# Patient Record
Sex: Female | Born: 1945 | Race: White | Hispanic: No | Marital: Married | Smoking: Never smoker
Health system: Southern US, Community
[De-identification: ages and names within clinical notes are randomized; demographics above are authoritative.]

## PROBLEM LIST (undated history)

## (undated) DIAGNOSIS — C50919 Malignant neoplasm of unspecified site of unspecified female breast: Secondary | ICD-10-CM

## (undated) DIAGNOSIS — L570 Actinic keratosis: Secondary | ICD-10-CM

## (undated) DIAGNOSIS — I10 Essential (primary) hypertension: Secondary | ICD-10-CM

## (undated) DIAGNOSIS — Z923 Personal history of irradiation: Secondary | ICD-10-CM

## (undated) HISTORY — DX: Actinic keratosis: L57.0

---

## 2006-05-04 ENCOUNTER — Ambulatory Visit: Payer: Self-pay | Admitting: Unknown Physician Specialty

## 2006-10-12 HISTORY — PX: BREAST LUMPECTOMY: SHX2

## 2006-10-12 HISTORY — PX: BREAST BIOPSY: SHX20

## 2007-05-04 ENCOUNTER — Encounter: Payer: Self-pay | Admitting: Family Medicine

## 2008-12-11 ENCOUNTER — Ambulatory Visit: Payer: Self-pay | Admitting: Internal Medicine

## 2008-12-11 DIAGNOSIS — D0590 Unspecified type of carcinoma in situ of unspecified breast: Secondary | ICD-10-CM | POA: Insufficient documentation

## 2008-12-11 DIAGNOSIS — I10 Essential (primary) hypertension: Secondary | ICD-10-CM | POA: Insufficient documentation

## 2009-03-26 ENCOUNTER — Ambulatory Visit: Payer: Self-pay | Admitting: Internal Medicine

## 2009-03-26 LAB — CONVERTED CEMR LAB
ALT: 21 units/L (ref 0–35)
AST: 25 units/L (ref 0–37)
Alkaline Phosphatase: 107 units/L (ref 39–117)
BUN: 19 mg/dL (ref 6–23)
Basophils Relative: 0.1 % (ref 0.0–3.0)
Bilirubin, Direct: 0 mg/dL (ref 0.0–0.3)
Chloride: 107 meq/L (ref 96–112)
Creatinine, Ser: 1.2 mg/dL (ref 0.4–1.2)
Eosinophils Relative: 2.6 % (ref 0.0–5.0)
GFR calc non Af Amer: 48.48 mL/min (ref >60)
LDL Cholesterol: 110 mg/dL — ABNORMAL HIGH (ref 0–99)
MCV: 92.5 fL (ref 78.0–100.0)
Monocytes Absolute: 0.4 10*3/uL (ref 0.1–1.0)
Monocytes Relative: 7 % (ref 3.0–12.0)
Neutrophils Relative %: 69 % (ref 43.0–77.0)
Platelets: 237 10*3/uL (ref 150.0–400.0)
Potassium: 3.7 meq/L (ref 3.5–5.1)
RBC: 4.37 M/uL (ref 3.87–5.11)
Total Bilirubin: 0.9 mg/dL (ref 0.3–1.2)
Total CHOL/HDL Ratio: 4
Total Protein: 7.7 g/dL (ref 6.0–8.3)
Triglycerides: 67 mg/dL (ref 0.0–149.0)
VLDL: 13.4 mg/dL (ref 0.0–40.0)
WBC: 6.4 10*3/uL (ref 4.5–10.5)

## 2009-07-02 ENCOUNTER — Ambulatory Visit: Payer: Self-pay | Admitting: Internal Medicine

## 2009-07-02 DIAGNOSIS — J069 Acute upper respiratory infection, unspecified: Secondary | ICD-10-CM | POA: Insufficient documentation

## 2009-07-04 ENCOUNTER — Telehealth (INDEPENDENT_AMBULATORY_CARE_PROVIDER_SITE_OTHER): Payer: Self-pay | Admitting: *Deleted

## 2009-10-12 DIAGNOSIS — C439 Malignant melanoma of skin, unspecified: Secondary | ICD-10-CM

## 2009-10-12 HISTORY — DX: Malignant melanoma of skin, unspecified: C43.9

## 2009-12-23 ENCOUNTER — Encounter: Payer: Self-pay | Admitting: Internal Medicine

## 2010-05-30 ENCOUNTER — Encounter: Payer: Self-pay | Admitting: Internal Medicine

## 2010-11-03 ENCOUNTER — Other Ambulatory Visit: Payer: Self-pay

## 2010-11-07 ENCOUNTER — Inpatient Hospital Stay: Payer: Self-pay | Admitting: Specialist

## 2010-11-11 NOTE — Letter (Signed)
Summary: Medical Clearance for Exercise Program  Medical Clearance for Exercise Program   Imported By: Maryln Gottron 12/25/2009 12:14:47  _____________________________________________________________________  External Attachment:    Type:   Image     Comment:   External Document

## 2010-11-11 NOTE — Letter (Signed)
Summary: [facility]  [facility]   Imported By: Maryln Gottron 06/13/2010 15:38:08  _____________________________________________________________________  External Attachment:    Type:   Image     Comment:   External Document

## 2010-11-12 LAB — PATHOLOGY REPORT

## 2011-04-23 ENCOUNTER — Ambulatory Visit: Payer: Self-pay | Admitting: Internal Medicine

## 2016-10-27 ENCOUNTER — Other Ambulatory Visit: Payer: Self-pay | Admitting: Otolaryngology

## 2016-10-27 DIAGNOSIS — R1310 Dysphagia, unspecified: Secondary | ICD-10-CM

## 2016-11-10 ENCOUNTER — Other Ambulatory Visit: Payer: Self-pay

## 2016-12-14 ENCOUNTER — Ambulatory Visit
Admission: RE | Admit: 2016-12-14 | Discharge: 2016-12-14 | Disposition: A | Discharge status: Home / Self Care | Payer: BLUE CROSS/BLUE SHIELD | Source: Ambulatory Visit | Attending: Otolaryngology | Admitting: Otolaryngology

## 2016-12-14 DIAGNOSIS — R1314 Dysphagia, pharyngoesophageal phase: Secondary | ICD-10-CM | POA: Diagnosis present

## 2016-12-14 DIAGNOSIS — R1312 Dysphagia, oropharyngeal phase: Secondary | ICD-10-CM

## 2016-12-14 NOTE — Therapy (Signed)
Kendra Vang, [place], [address] Phone: [phone]   Fax:     Modified Barium Swallow  Patient Details  Name: Kendra Vang MRN: [id number] Date of Birth: 1945-11-01 No Data Recorded  Encounter Date: 12/14/2016      End of Session - 12/14/16 1338    Visit Number 1   Number of Visits 1   Date for SLP Re-Evaluation 12/14/16   SLP Start Time [id number]   SLP Stop Time  1339   SLP Time Calculation (min) 50 min   Activity Tolerance Patient tolerated treatment well      No past medical history on file.  No past surgical history on file.  There were no vitals filed for this visit.   Subjective: Patient behavior: (alertness, ability to follow instructions, etc.): The patient is able to follow directions and express her swallowing symptoms.  Chief complaint:  Foods get stuck, happens less if she avoids bread and takes small bites/sips   Objective:  Radiological Procedure: A videoflouroscopic evaluation of oral-preparatory, reflex initiation, and pharyngeal phases of the swallow was performed; as well as a screening of the upper esophageal phase.  I. POSTURE: Upright in MBS chair and standing  II. VIEW: Lateral and A-P  III. COMPENSATORY STRATEGIES: N/A  IV. BOLUSES ADMINISTERED:   Thin Liquid: 2 small sips, 4 rapid consecutive sips   Nectar-thick Liquid: 2 moderate size sips   Honey-thick Liquid: N/A   Puree: 3 teaspoon presentations   Mechanical Soft: 1/4 graham cracker in applesauce  V. RESULTS OF EVALUATION: A. ORAL PREPARATORY PHASE: (The lips, tongue, and velum are observed for strength and coordination)       **Overall Severity Rating: Within normal limits  B. SWALLOW INITIATION/REFLEX: (The reflex is normal if "triggered" by the time the bolus reached the base of the tongue)  **Overall Severity Rating: Minimal; triggers at the valleculae  C. PHARYNGEAL PHASE: (Pharyngeal function is  normal if the bolus shows rapid, smooth, and continuous transit through the pharynx and there is no pharyngeal residue after the swallow)  **Overall Severity Rating: Mild; reduced hyolaryngeal excursion and incomplete epiglottic inversion.  Minimal pharyngeal residue post swallow- primarily valleculae.  D. LARYNGEAL PENETRATION: (Material entering into the laryngeal inlet/vestibule but not aspirated) None  E. ASPIRATION: None  F. ESOPHAGEAL PHASE: (Screening of the upper esophagus) In the cervical esophagus there is a finger-like protrusion along the posterior wall during swallow (does not impede flow of boluses) consistent with prominent cricopharyngeus.  An esophageal sweep in the upright position with liquid and solid consistencies showed mid- and distal-esophageal retention with retrograde flow below the level of the pharyngoesophageal segment.     ASSESSMENT: This 71 year old woman, with difficulty swallowing solids and known thyroid nodule, is presenting with minimal oropharyngeal dysphagia.  Oral control of the bolus including oral hold, rotary mastication, and anterior to posterior transfer are within normal limits. Timing of the pharyngeal swallow is minimally delayed, triggering at the valleculae.  In the pharyngeal stage of swallowing the patient demonstrates decreased hyolaryngeal excursion and incomplete epiglottic inversion.  There is minimal residue post swallow, primarily in the valleculae.  There was no observed laryngeal penetration or tracheal aspiration.  The patient is not at significant risk for prandial aspiration and the minor changes in her oropharyngeal swallow function do not appear to be the source of her symptoms.  In the cervical esophagus there is a finger-like protrusion along the posterior wall during swallow (does  not impede flow of boluses) consistent with prominent cricopharyngeus.  An esophageal sweep in the upright position with liquid and solid consistencies showed  mid- and distal-esophageal retention with retrograde flow below the level of the pharyngoesophageal segment.   The patient was counseled to eat slowly, chew well, small bites and sips, and alternate liquids and solids.  PLAN/RECOMMENDATIONS:   A. Diet: Regular as tolerated- soften and moisten as needed   B. Swallowing Precautions:  eat slowly, chew well, small bites and sips, and alternate liquids and solids.   C. Recommended consultation to: follow up with Dr. Pryor Ochoa as recommended   D. Therapy recommendations N/A   E. Results and recommendations were discussed with the patient immediately following the study and the final report routed to the referring MD.    Oropharyngeal dysphagia - Plan: DG SWALLOWING [place], DG SWALLOWING [facility]        Problem List Patient Active Problem List   Diagnosis Date Noted  . URI 07/02/2009  . CARCINOMA IN SITU OF BREAST 12/11/2008  . HYPERTENSION 12/11/2008   Leroy Sea, MS/CCC- SLP  Lou Miner 12/14/2016, 1:40 PM  [facility] [place], [place], [address] Phone: [phone]   Fax:     Name: Kendra Vang MRN: [id number] Date of Birth: [birth year]

## 2017-03-11 ENCOUNTER — Other Ambulatory Visit: Payer: Self-pay | Admitting: Internal Medicine

## 2017-03-11 DIAGNOSIS — Z853 Personal history of malignant neoplasm of breast: Secondary | ICD-10-CM

## 2017-03-12 ENCOUNTER — Other Ambulatory Visit: Payer: Self-pay | Admitting: *Deleted

## 2017-03-12 ENCOUNTER — Inpatient Hospital Stay
Admission: RE | Admit: 2017-03-12 | Discharge: 2017-03-12 | Disposition: A | Discharge status: Home / Self Care | Payer: Self-pay | Source: Ambulatory Visit | Attending: *Deleted | Admitting: *Deleted

## 2017-03-12 DIAGNOSIS — Z9289 Personal history of other medical treatment: Secondary | ICD-10-CM

## 2017-03-21 ENCOUNTER — Encounter: Payer: Self-pay | Admitting: Emergency Medicine

## 2017-03-21 ENCOUNTER — Emergency Department
Admission: EM | Admit: 2017-03-21 | Discharge: 2017-03-22 | Disposition: A | Discharge status: Home / Self Care | Payer: BLUE CROSS/BLUE SHIELD | Attending: Emergency Medicine | Admitting: Emergency Medicine

## 2017-03-21 DIAGNOSIS — I10 Essential (primary) hypertension: Secondary | ICD-10-CM

## 2017-03-21 HISTORY — DX: Essential (primary) hypertension: I10

## 2017-03-21 LAB — URINALYSIS, COMPLETE (UACMP) WITH MICROSCOPIC
BILIRUBIN URINE: NEGATIVE
Glucose, UA: NEGATIVE mg/dL
KETONES UR: NEGATIVE mg/dL
NITRITE: NEGATIVE
Protein, ur: 30 mg/dL — AB
SPECIFIC GRAVITY, URINE: 1.003 — AB (ref 1.005–1.030)
pH: 6 (ref 5.0–8.0)

## 2017-03-21 LAB — CBC
HEMATOCRIT: 40 % (ref 35.0–47.0)
HEMOGLOBIN: 13.6 g/dL (ref 12.0–16.0)
MCH: 30.2 pg (ref 26.0–34.0)
MCHC: 33.9 g/dL (ref 32.0–36.0)
MCV: 88.9 fL (ref 80.0–100.0)
Platelets: 283 10*3/uL (ref 150–440)
RBC: 4.5 MIL/uL (ref 3.80–5.20)
RDW: 14.3 % (ref 11.5–14.5)
WBC: 12.5 10*3/uL — ABNORMAL HIGH (ref 3.6–11.0)

## 2017-03-21 LAB — BASIC METABOLIC PANEL
Anion gap: 9 (ref 5–15)
BUN: 27 mg/dL — ABNORMAL HIGH (ref 6–20)
CHLORIDE: 104 mmol/L (ref 101–111)
CO2: 27 mmol/L (ref 22–32)
Calcium: 9.3 mg/dL (ref 8.9–10.3)
Creatinine, Ser: 1.25 mg/dL — ABNORMAL HIGH (ref 0.44–1.00)
GFR, EST AFRICAN AMERICAN: 50 mL/min — AB (ref >60)
GFR, EST NON AFRICAN AMERICAN: 43 mL/min — AB (ref >60)
GLUCOSE: 100 mg/dL — AB (ref 65–99)
POTASSIUM: 3.5 mmol/L (ref 3.5–5.1)
SODIUM: 140 mmol/L (ref 135–145)

## 2017-03-21 LAB — TROPONIN I

## 2017-03-21 MED ORDER — LORAZEPAM 2 MG/ML IJ SOLN
0.5000 mg | Freq: Once | INTRAMUSCULAR | Status: AC
  Administered 2017-03-21: 0.5 mg via INTRAVENOUS
  Filled 2017-03-21: qty 1

## 2017-03-21 NOTE — ED Triage Notes (Signed)
Pt ambulatory to triage with steady slow gait. Pt had a procedure on Thursday where Dr. Tiffany Kocher stretched pts esophagus and removed "several" polyps. Per pt, since procedure, pt has had uncontrolled hypertension. Pt denies symptoms of chest pain, headache or visual changes. Pts BP in triage 265/114. Pt taken to RM 15.

## 2017-03-21 NOTE — ED Provider Notes (Signed)
[facility] Emergency Department Provider Note  Time seen: 11:28 PM  I have reviewed the triage vital signs and the nursing notes.   HISTORY  Chief Complaint Hypertension    HPI Kendra Vang is a 71 y.o. female with a past medical history of hypertension who presents to the emergency department for elevated blood pressure. According to the patient she had an endoscopy/colonoscopy performed on Thursday. She states Thursday night she had significant nausea and vomiting. Her doctor called her in a nausea medication which stopped the nausea and vomiting. She states since then she has been checking her blood pressure home and he continues to go up and up. She states at home today it was over 200 so she came to the emergency department for evaluation. Patient denies any headache, dizziness, lightheadedness, chest pain or trouble breathing. Overall she says she feels normal besides a feeling like her blood pressure is up. Patient states she has taken 1-1/2 valsartan and tablets today in an attempt to try to bring the blood pressure down but it has not worked.  Past Medical History:  Diagnosis Date  . Hypertension     Patient Active Problem List   Diagnosis Date Noted  . URI 07/02/2009  . CARCINOMA IN SITU OF BREAST 12/11/2008  . HYPERTENSION 12/11/2008    History reviewed. No pertinent surgical history.  Prior to Admission medications   Not on File    Allergies  Allergen Reactions  . Lactose Intolerance (Gi)     History reviewed. No pertinent family history.  Social History Social History  Substance Use Topics  . Smoking status: Never Smoker  . Smokeless tobacco: Never Used  . Alcohol use No    Review of Systems Constitutional: Negative for fever. Cardiovascular: Negative for chest pain. Respiratory: Negative for shortness of breath. Gastrointestinal: Negative for abdominal pain Genitourinary: Negative for dysuria. Musculoskeletal: Negative for  back pain. Neurological: Negative for headaches, focal weakness or numbness. All other ROS negative  ____________________________________________   PHYSICAL EXAM:  VITAL SIGNS: ED Triage Vitals  Enc Vitals Group     BP 03/21/17 2225 (!) 257/114     Pulse Rate 03/21/17 2225 87     Resp 03/21/17 2225 16     Temp 03/21/17 2225 98.1 F (36.7 C)     Temp Source 03/21/17 2225 Oral     SpO2 03/21/17 2225 98 %     Weight 03/21/17 2227 177 lb (80.3 kg)     Height 03/21/17 2227 5\' 4"  (1.626 m)     Head Circumference --      Peak Flow --      Pain Score --      Pain Loc --      Pain Edu? --      Excl. in [place]? --     Constitutional: Alert and oriented. Well appearing and in no distress. Eyes: Normal exam ENT   Head: Normocephalic and atraumatic.   Mouth/Throat: Mucous membranes are moist. Cardiovascular: Normal rate, regular rhythm. No murmur Respiratory: Normal respiratory effort without tachypnea nor retractions. Breath sounds are clear Gastrointestinal: Soft and nontender. No distention.  Musculoskeletal: Nontender with normal range of motion in all extremities. No lower extremity tenderness or edema. Neurologic:  Normal speech and language. No gross focal neurologic deficits  Skin:  Skin is warm, dry and intact.  Psychiatric: Mood and affect are normal.  ____________________________________________    EKG  EKG reviewed and interpreted by myself shows normal sinus rhythm at 84 bpm,  slightly widened QRS, normal axis, Larson normal intervals and nonspecific ST changes but no ST elevation. RSR most consistent with right bundle branch block.  ____________________________________________    INITIAL IMPRESSION / ASSESSMENT AND PLAN / ED COURSE  Pertinent labs & imaging results that were available during my care of the patient were reviewed by me and considered in my medical decision making (see chart for details).  The patient presents emergency department for elevated  blood pressure home 977 systolic at home prior to arrival to the emergency department. Currently 197/87. Patient is somewhat anxious appearing, is worried about the blood pressure. I discussed at length with the patient blood pressure in an attempt to reassure the patient. I suspect anxiety is playing a significant component in the patient's blood pressure. She is agreeable to a onetime Ativan dose in the emergency department, we'll check labs including cardiac enzymes and continue to closely monitor. Patient agreeable plan.  Patient blood pressure has decreased significantly with Ativan. Patient did receive 0.1 mg of clonidine as well. Currently 138/70. Patient is feeling well remains asymptomatic, is somewhat tired after Ativan administration. Patient will be discharged with her daughter. We'll follow up with her primary care doctor regarding her blood pressure.  ____________________________________________   FINAL CLINICAL IMPRESSION(S) / ED DIAGNOSES  Hypertension    Harvest Dark, MD 03/22/17 [phone]

## 2017-03-22 MED ORDER — LORAZEPAM 2 MG/ML IJ SOLN
0.5000 mg | Freq: Once | INTRAMUSCULAR | Status: AC
Start: 2017-03-22 — End: 2017-03-22
  Administered 2017-03-22: 0.5 mg via INTRAVENOUS
  Filled 2017-03-22: qty 1

## 2017-03-22 MED ORDER — CLONIDINE HCL 0.1 MG PO TABS
0.1000 mg | ORAL_TABLET | Freq: Once | ORAL | Status: AC
  Administered 2017-03-22: 0.1 mg via ORAL
  Filled 2017-03-22: qty 1

## 2017-03-22 MED ORDER — CLONIDINE HCL 0.1 MG PO TABS: 0.2000 mg | ORAL_TABLET | Freq: Once | ORAL | Status: DC

## 2017-05-19 ENCOUNTER — Other Ambulatory Visit: Payer: BLUE CROSS/BLUE SHIELD

## 2017-06-04 ENCOUNTER — Other Ambulatory Visit: Payer: BLUE CROSS/BLUE SHIELD

## 2017-06-22 ENCOUNTER — Other Ambulatory Visit: Payer: Self-pay | Admitting: Internal Medicine

## 2017-06-22 ENCOUNTER — Ambulatory Visit
Admission: RE | Admit: 2017-06-22 | Discharge: 2017-06-22 | Disposition: A | Discharge status: Home / Self Care | Payer: BLUE CROSS/BLUE SHIELD | Source: Ambulatory Visit | Attending: Internal Medicine | Admitting: Internal Medicine

## 2017-06-22 DIAGNOSIS — Z853 Personal history of malignant neoplasm of breast: Secondary | ICD-10-CM

## 2017-06-22 DIAGNOSIS — Z1231 Encounter for screening mammogram for malignant neoplasm of breast: Secondary | ICD-10-CM | POA: Diagnosis not present

## 2018-08-02 ENCOUNTER — Other Ambulatory Visit: Payer: Self-pay | Admitting: Internal Medicine

## 2018-08-02 DIAGNOSIS — Z1231 Encounter for screening mammogram for malignant neoplasm of breast: Secondary | ICD-10-CM

## 2018-08-25 ENCOUNTER — Ambulatory Visit
Admission: RE | Admit: 2018-08-25 | Discharge: 2018-08-25 | Disposition: A | Discharge status: Home / Self Care | Payer: BLUE CROSS/BLUE SHIELD | Source: Ambulatory Visit | Attending: Internal Medicine | Admitting: Internal Medicine

## 2018-08-25 DIAGNOSIS — Z1231 Encounter for screening mammogram for malignant neoplasm of breast: Secondary | ICD-10-CM | POA: Insufficient documentation

## 2018-08-25 HISTORY — DX: Personal history of irradiation: Z92.3

## 2018-08-25 HISTORY — DX: Malignant neoplasm of unspecified site of unspecified female breast: C50.919

## 2018-08-29 ENCOUNTER — Other Ambulatory Visit: Payer: Self-pay | Admitting: Internal Medicine

## 2018-08-29 DIAGNOSIS — R928 Other abnormal and inconclusive findings on diagnostic imaging of breast: Secondary | ICD-10-CM

## 2018-08-29 DIAGNOSIS — N6310 Unspecified lump in the right breast, unspecified quadrant: Secondary | ICD-10-CM

## 2019-01-14 ENCOUNTER — Other Ambulatory Visit: Payer: Self-pay

## 2019-01-14 ENCOUNTER — Inpatient Hospital Stay
Admission: EM | Admit: 2019-01-14 | Discharge: 2019-01-16 | DRG: 872 | Disposition: A | Discharge status: Home / Self Care | Payer: BLUE CROSS/BLUE SHIELD | Attending: Internal Medicine | Admitting: Internal Medicine

## 2019-01-14 ENCOUNTER — Emergency Department: Payer: BLUE CROSS/BLUE SHIELD

## 2019-01-14 DIAGNOSIS — E876 Hypokalemia: Secondary | ICD-10-CM | POA: Diagnosis present

## 2019-01-14 DIAGNOSIS — N390 Urinary tract infection, site not specified: Secondary | ICD-10-CM | POA: Diagnosis present

## 2019-01-14 DIAGNOSIS — A4151 Sepsis due to Escherichia coli [E. coli]: Principal | ICD-10-CM | POA: Diagnosis present

## 2019-01-14 DIAGNOSIS — Z803 Family history of malignant neoplasm of breast: Secondary | ICD-10-CM

## 2019-01-14 DIAGNOSIS — Z923 Personal history of irradiation: Secondary | ICD-10-CM | POA: Diagnosis not present

## 2019-01-14 DIAGNOSIS — E861 Hypovolemia: Secondary | ICD-10-CM | POA: Diagnosis present

## 2019-01-14 DIAGNOSIS — E739 Lactose intolerance, unspecified: Secondary | ICD-10-CM | POA: Diagnosis present

## 2019-01-14 DIAGNOSIS — I10 Essential (primary) hypertension: Secondary | ICD-10-CM | POA: Diagnosis present

## 2019-01-14 DIAGNOSIS — I9589 Other hypotension: Secondary | ICD-10-CM

## 2019-01-14 DIAGNOSIS — Z853 Personal history of malignant neoplasm of breast: Secondary | ICD-10-CM

## 2019-01-14 DIAGNOSIS — N179 Acute kidney failure, unspecified: Secondary | ICD-10-CM | POA: Diagnosis present

## 2019-01-14 DIAGNOSIS — E860 Dehydration: Secondary | ICD-10-CM | POA: Diagnosis present

## 2019-01-14 DIAGNOSIS — M810 Age-related osteoporosis without current pathological fracture: Secondary | ICD-10-CM | POA: Diagnosis present

## 2019-01-14 DIAGNOSIS — K219 Gastro-esophageal reflux disease without esophagitis: Secondary | ICD-10-CM | POA: Diagnosis present

## 2019-01-14 DIAGNOSIS — N3090 Cystitis, unspecified without hematuria: Secondary | ICD-10-CM

## 2019-01-14 LAB — CBC WITH DIFFERENTIAL/PLATELET
Abs Immature Granulocytes: 0.05 10*3/uL (ref 0.00–0.07)
Basophils Absolute: 0 10*3/uL (ref 0.0–0.1)
Basophils Relative: 0 %
Eosinophils Absolute: 0 10*3/uL (ref 0.0–0.5)
Eosinophils Relative: 0 %
HCT: 36.6 % (ref 36.0–46.0)
Hemoglobin: 12.3 g/dL (ref 12.0–15.0)
Immature Granulocytes: 0 %
Lymphocytes Relative: 9 %
Lymphs Abs: 1 10*3/uL (ref 0.7–4.0)
MCH: 29.7 pg (ref 26.0–34.0)
MCHC: 33.6 g/dL (ref 30.0–36.0)
MCV: 88.4 fL (ref 80.0–100.0)
Monocytes Absolute: 1.3 10*3/uL — ABNORMAL HIGH (ref 0.1–1.0)
Monocytes Relative: 12 %
Neutro Abs: 8.8 10*3/uL — ABNORMAL HIGH (ref 1.7–7.7)
Neutrophils Relative %: 79 %
Platelets: 231 10*3/uL (ref 150–400)
RBC: 4.14 MIL/uL (ref 3.87–5.11)
RDW: 13.2 % (ref 11.5–15.5)
WBC: 11.2 10*3/uL — ABNORMAL HIGH (ref 4.0–10.5)
nRBC: 0 % (ref 0.0–0.2)

## 2019-01-14 LAB — COMPREHENSIVE METABOLIC PANEL
ALT: 35 U/L (ref 0–44)
AST: 41 U/L (ref 15–41)
Albumin: 4 g/dL (ref 3.5–5.0)
Alkaline Phosphatase: 100 U/L (ref 38–126)
Anion gap: 14 (ref 5–15)
BUN: 29 mg/dL — ABNORMAL HIGH (ref 8–23)
CO2: 22 mmol/L (ref 22–32)
Calcium: 8.5 mg/dL — ABNORMAL LOW (ref 8.9–10.3)
Chloride: 99 mmol/L (ref 98–111)
Creatinine, Ser: 2.01 mg/dL — ABNORMAL HIGH (ref 0.44–1.00)
GFR calc Af Amer: 28 mL/min — ABNORMAL LOW (ref >60)
GFR calc non Af Amer: 24 mL/min — ABNORMAL LOW (ref >60)
Glucose, Bld: 130 mg/dL — ABNORMAL HIGH (ref 70–99)
Potassium: 3.2 mmol/L — ABNORMAL LOW (ref 3.5–5.1)
Sodium: 135 mmol/L (ref 135–145)
Total Bilirubin: 1.4 mg/dL — ABNORMAL HIGH (ref 0.3–1.2)
Total Protein: 7.7 g/dL (ref 6.5–8.1)

## 2019-01-14 LAB — URINALYSIS, COMPLETE (UACMP) WITH MICROSCOPIC
Bilirubin Urine: NEGATIVE
Glucose, UA: NEGATIVE mg/dL
Ketones, ur: NEGATIVE mg/dL
Nitrite: NEGATIVE
Protein, ur: 100 mg/dL — AB
Specific Gravity, Urine: 1.021 (ref 1.005–1.030)
WBC, UA: >50 WBC/hpf — ABNORMAL HIGH (ref 0–5)
pH: 5 (ref 5.0–8.0)

## 2019-01-14 LAB — LACTIC ACID, PLASMA: Lactic Acid, Venous: 1.1 mmol/L (ref 0.5–1.9)

## 2019-01-14 MED ORDER — SODIUM CHLORIDE 0.9 % IV BOLUS
1000.0000 mL | Freq: Once | INTRAVENOUS | Status: AC
  Administered 2019-01-14: 1000 mL via INTRAVENOUS

## 2019-01-14 MED ORDER — SODIUM CHLORIDE 0.9 % IV SOLN
1.0000 g | INTRAVENOUS | Status: DC
  Administered 2019-01-14: 1 g via INTRAVENOUS
  Filled 2019-01-14 (×2): qty 10

## 2019-01-14 MED ORDER — POTASSIUM CHLORIDE CRYS ER 20 MEQ PO TBCR
40.0000 meq | EXTENDED_RELEASE_TABLET | Freq: Once | ORAL | Status: AC
  Administered 2019-01-14: 17:00:00 40 meq via ORAL
  Filled 2019-01-14: qty 2

## 2019-01-14 MED ORDER — HEPARIN SODIUM (PORCINE) 5000 UNIT/ML IJ SOLN
5000.0000 [IU] | Freq: Three times a day (TID) | INTRAMUSCULAR | Status: DC
  Administered 2019-01-14 – 2019-01-16 (×5): 5000 [IU] via SUBCUTANEOUS
  Filled 2019-01-14 (×8): qty 1

## 2019-01-14 MED ORDER — SODIUM CHLORIDE 0.9 % IV SOLN
INTRAVENOUS | Status: DC
  Administered 2019-01-14 – 2019-01-15 (×3): via INTRAVENOUS

## 2019-01-14 MED ORDER — ONDANSETRON HCL 4 MG/2ML IJ SOLN
4.0000 mg | Freq: Four times a day (QID) | INTRAMUSCULAR | Status: DC | PRN
  Administered 2019-01-15 – 2019-01-16 (×2): 4 mg via INTRAVENOUS
  Filled 2019-01-14 (×2): qty 2

## 2019-01-14 MED ORDER — SODIUM CHLORIDE 0.9 % IV SOLN
1.0000 g | INTRAVENOUS | Status: DC
  Administered 2019-01-15: 09:00:00 1 g via INTRAVENOUS
  Filled 2019-01-14 (×2): qty 10

## 2019-01-14 MED ORDER — ONDANSETRON HCL 4 MG PO TABS: 4.0000 mg | ORAL_TABLET | Freq: Four times a day (QID) | ORAL | Status: DC | PRN

## 2019-01-14 MED ORDER — SENNOSIDES-DOCUSATE SODIUM 8.6-50 MG PO TABS: 1.0000 | ORAL_TABLET | Freq: Every evening | ORAL | Status: DC | PRN

## 2019-01-14 MED ORDER — HYDRALAZINE HCL 20 MG/ML IJ SOLN
10.0000 mg | INTRAMUSCULAR | Status: DC | PRN
  Administered 2019-01-15 – 2019-01-16 (×2): 10 mg via INTRAVENOUS
  Filled 2019-01-14 (×2): qty 1

## 2019-01-14 MED ORDER — ACETAMINOPHEN 325 MG PO TABS
650.0000 mg | ORAL_TABLET | Freq: Four times a day (QID) | ORAL | Status: DC | PRN
  Administered 2019-01-14 – 2019-01-16 (×6): 650 mg via ORAL
  Filled 2019-01-14 (×6): qty 2

## 2019-01-14 MED ORDER — ACETAMINOPHEN 650 MG RE SUPP: 650.0000 mg | Freq: Four times a day (QID) | RECTAL | Status: DC | PRN

## 2019-01-14 NOTE — ED Notes (Signed)
Patient using ascom at this time updating daughter on results/ doctors recommendation

## 2019-01-14 NOTE — ED Notes (Signed)
X-ray at bedside

## 2019-01-14 NOTE — ED Notes (Signed)
ED TO INPATIENT HANDOFF REPORT  ED Nurse Name and Phone #: Joe Tanney [phone]  S Name/Age/Gender Kendra Vang 73 y.o. female Room/Bed: ED15A/ED15A  Code Status   Code Status: Not on file  Home/SNF/Other Home Patient oriented to: self, place, time and situation Is this baseline? Yes   Triage Complete: Triage complete  Chief Complaint urinary symptoms  Triage Note Pt states weakness x 1 week. States fever of 101 this AM. States has noticed cloudy urine x few days. Also states "my BP has been lower than normal" states dysuria and urgency but can't get much urine out. A&O, in wheelchair. Took 2 tylenol this AM. Took 1 amoxicillin "from when I go to the dentist" this AM.    Allergies Allergies  Allergen Reactions  . Lactose Intolerance (Gi)     Level of Care/Admitting Diagnosis ED Disposition    ED Disposition Condition [facility] Area: [place] [[id number]]  Level of Care: Med-Surg [16]  Diagnosis: AKI (acute kidney injury) [facility]) [[id number]]  Admitting Physician: Saundra Shelling [[id number]]  Attending Physician: Saundra Shelling [[id number]]  Estimated length of stay: past midnight tomorrow  Certification:: I certify this patient will need inpatient services for at least 2 midnights  PT Class (Do Not Modify): Inpatient [101]  PT Acc Code (Do Not Modify): Private [1]       B Medical/Surgery History Past Medical History:  Diagnosis Date  . Breast cancer ([place])   . Hypertension   . Personal history of radiation therapy    Past Surgical History:  Procedure Laterality Date  . BREAST BIOPSY Left 2008   positive  . BREAST LUMPECTOMY Left 2008   with radiaton     A IV Location/Drains/Wounds Patient Lines/Drains/Airways Status   Active Line/Drains/Airways    Name:   Placement date:   Placement time:   Site:   Days:   Peripheral IV 01/14/19 Left Antecubital   01/14/19    1301    Antecubital   less than 1   Peripheral IV 01/14/19 Right Forearm    01/14/19    1347    Forearm   less than 1          Intake/Output Last 24 hours  Intake/Output Summary (Last 24 hours) at 01/14/2019 1545 Last data filed at 01/14/2019 1454 Gross per 24 hour  Intake 100 ml  Output -  Net 100 ml    Labs/Imaging Results for orders placed or performed during the hospital encounter of 01/14/19 (from the past 48 hour(s))  CBC with Differential/Platelet     Status: Abnormal   Collection Time: 01/14/19 12:52 PM  Result Value Ref Range   WBC 11.2 (H) 4.0 - 10.5 K/uL   RBC 4.14 3.87 - 5.11 MIL/uL   Hemoglobin 12.3 12.0 - 15.0 g/dL   HCT 36.6 36.0 - 46.0 %   MCV 88.4 80.0 - 100.0 fL   MCH 29.7 26.0 - 34.0 pg   MCHC 33.6 30.0 - 36.0 g/dL   RDW 13.2 11.5 - 15.5 %   Platelets 231 150 - 400 K/uL   nRBC 0.0 0.0 - 0.2 %   Neutrophils Relative % 79 %   Neutro Abs 8.8 (H) 1.7 - 7.7 K/uL   Lymphocytes Relative 9 %   Lymphs Abs 1.0 0.7 - 4.0 K/uL   Monocytes Relative 12 %   Monocytes Absolute 1.3 (H) 0.1 - 1.0 K/uL   Eosinophils Relative 0 %   Eosinophils Absolute 0.0 0.0 - 0.5 K/uL  Basophils Relative 0 %   Basophils Absolute 0.0 0.0 - 0.1 K/uL   Immature Granulocytes 0 %   Abs Immature Granulocytes 0.05 0.00 - 0.07 K/uL    Comment: Performed at [facility], [address] [place], [place] [address]  Comprehensive metabolic panel     Status: Abnormal   Collection Time: 01/14/19 12:52 PM  Result Value Ref Range   Sodium 135 135 - 145 mmol/L   Potassium 3.2 (L) 3.5 - 5.1 mmol/L   Chloride 99 98 - 111 mmol/L   CO2 22 22 - 32 mmol/L   Glucose, Bld 130 (H) 70 - 99 mg/dL   BUN 29 (H) 8 - 23 mg/dL   Creatinine, Ser 2.01 (H) 0.44 - 1.00 mg/dL   Calcium 8.5 (L) 8.9 - 10.3 mg/dL   Total Protein 7.7 6.5 - 8.1 g/dL   Albumin 4.0 3.5 - 5.0 g/dL   AST 41 15 - 41 U/L   ALT 35 0 - 44 U/L   Alkaline Phosphatase 100 38 - 126 U/L   Total Bilirubin 1.4 (H) 0.3 - 1.2 mg/dL   GFR calc non Af Amer 24 (L) >60 mL/min   GFR calc Af Amer 28 (L) >60 mL/min    Anion gap 14 5 - 15    Comment: Performed at [facility], [address] [place], [place] [address]  Lactic acid, plasma     Status: None   Collection Time: 01/14/19 12:52 PM  Result Value Ref Range   Lactic Acid, Venous 1.1 0.5 - 1.9 mmol/L    Comment: Performed at [facility], [address] [place], [place] [address]  Urinalysis, Complete w Microscopic     Status: Abnormal   Collection Time: 01/14/19  1:39 PM  Result Value Ref Range   Color, Urine Ireanna Finlayson (A) YELLOW    Comment: BIOCHEMICALS MAY BE AFFECTED BY COLOR   APPearance TURBID (A) CLEAR   Specific Gravity, Urine 1.021 1.005 - 1.030   pH 5.0 5.0 - 8.0   Glucose, UA NEGATIVE NEGATIVE mg/dL   Hgb urine dipstick SMALL (A) NEGATIVE   Bilirubin Urine NEGATIVE NEGATIVE   Ketones, ur NEGATIVE NEGATIVE mg/dL   Protein, ur 100 (A) NEGATIVE mg/dL   Nitrite NEGATIVE NEGATIVE   Leukocytes,Ua MODERATE (A) NEGATIVE   RBC / HPF 11-20 0 - 5 RBC/hpf   WBC, UA >50 (H) 0 - 5 WBC/hpf   Bacteria, UA RARE (A) NONE SEEN   Squamous Epithelial / LPF 0-5 0 - 5   WBC Clumps PRESENT    Mucus PRESENT     Comment: Performed at [facility], [address] [place], [place] [address]   Dg Chest Portable 1 View  Result Date: 01/14/2019 CLINICAL DATA:  Hypotension EXAM: PORTABLE CHEST 1 VIEW COMPARISON:  None. FINDINGS: The heart size and mediastinal contours are within normal limits. Both lungs are clear. The visualized skeletal structures are unremarkable. IMPRESSION: No active disease. Electronically Signed   By: Kathreen Devoid   On: 01/14/2019 13:15   Ct Renal Stone Study  Result Date: 01/14/2019 CLINICAL DATA:  Flank pain for 1 week EXAM: CT ABDOMEN AND PELVIS WITHOUT CONTRAST TECHNIQUE: Multidetector CT imaging of the abdomen and pelvis was performed following the standard protocol without IV contrast. COMPARISON:  None. FINDINGS: Lower chest: No acute abnormality. Hepatobiliary: No focal liver abnormality is seen.  Status post cholecystectomy. No biliary dilatation. Pancreas: Atrophy of the pancreatic head. No pancreatic ductal dilatation or surrounding inflammatory changes.  Spleen: Normal in size without focal abnormality. Adrenals/Urinary Tract: Normal adrenal glands. No urolithiasis or obstructive uropathy. 12 mm hypodense, fluid attenuating left renal mass consistent with a cyst. No other solid renal mass. Normal bladder. Stomach/Bowel: Small hiatal hernia. No evidence of bowel wall thickening, distention, or inflammatory changes. Diverticulosis without evidence of diverticulitis. Vascular/Lymphatic: Abdominal aortic atherosclerosis. Normal caliber abdominal aorta. No lymphadenopathy. Reproductive: Status post hysterectomy. No adnexal masses. Other: No abdominal wall hernia or abnormality. No abdominopelvic ascites. Musculoskeletal: No acute osseous abnormality. No aggressive osseous lesion. Right hip arthroplasty. Atrophy of the right rectus abdominus muscle. IMPRESSION: 1. No urolithiasis or obstructive uropathy. 2.  Aortic Atherosclerosis (ICD10-I70.0). Electronically Signed   By: Kathreen Devoid   On: 01/14/2019 14:20    Pending Labs Unresulted Labs (From admission, onward)    Start     Ordered   01/14/19 1248  Blood Culture (routine x 2)  BLOOD CULTURE X 2,   STAT     01/14/19 1247   01/14/19 1248  Urine culture  ONCE - STAT,   STAT     01/14/19 1247   Signed and Held  CBC  (heparin)  Once,   R    Comments:  Baseline for heparin therapy IF NOT ALREADY DRAWN.  Notify MD if PLT < 100 K.    Signed and Held   Signed and Held  Creatinine, serum  (heparin)  Once,   R    Comments:  Baseline for heparin therapy IF NOT ALREADY DRAWN.    Signed and Held   Signed and Held  Basic metabolic panel  Tomorrow morning,   R     Signed and Held   Signed and Held  CBC  Tomorrow morning,   R     Signed and Held          Vitals/Pain Today's Vitals   01/14/19 1454 01/14/19 1459 01/14/19 1500 01/14/19 1502  BP:  126/63  136/76   Pulse: 86 90  86  Resp: 13 14 13 16   Temp:      TempSrc:      SpO2: 99% 95%  99%  Weight:      Height:      PainSc:        Isolation Precautions No active isolations  Medications Medications  cefTRIAXone (ROCEPHIN) 1 g in sodium chloride 0.9 % 100 mL IVPB (0 g Intravenous Stopped 01/14/19 1454)  potassium chloride SA (K-DUR,KLOR-CON) CR tablet 40 mEq (has no administration in time range)  cefTRIAXone (ROCEPHIN) 1 g in sodium chloride 0.9 % 100 mL IVPB (has no administration in time range)  sodium chloride 0.9 % bolus 1,000 mL (1,000 mLs Intravenous Bolus from Bag 01/14/19 1352)    Mobility walks Low fall risk   Focused Assessments      R Recommendations: See Admitting Provider Note  Report given to:   Additional Notes: Patient independent

## 2019-01-14 NOTE — ED Notes (Signed)
Patient transported to CT 

## 2019-01-14 NOTE — ED Provider Notes (Signed)
[facility] Emergency Department Provider Note    First MD Initiated Contact with Patient 01/14/19 1240     (approximate)  I have reviewed the triage vital signs and the nursing notes.   HISTORY  Chief Complaint Urinary Frequency and Hypotension    HPI Ladana Chavero is a 73 y.o. female plosive past medical history presents the ER for 1 week of feeling generalized malaise weakness and cloudy urine.  Denies any pain.  States that she has felt lightheaded and has had decreased oral intake over the past 2 days.  Came to the ER because she was feeling febrile at home.  She did take 1 dose of amoxicillin this morning.  Denies any diarrhea nausea or vomiting.  No recent hospitalizations.  No known sick contacts.    Past Medical History:  Diagnosis Date  . Breast cancer ([place])   . Hypertension   . Personal history of radiation therapy    Family History  Problem Relation Age of Onset  . Breast cancer Sister 61   Past Surgical History:  Procedure Laterality Date  . BREAST BIOPSY Left 2008   positive  . BREAST LUMPECTOMY Left 2008   with radiaton   Patient Active Problem List   Diagnosis Date Noted  . URI 07/02/2009  . CARCINOMA IN SITU OF BREAST 12/11/2008  . HYPERTENSION 12/11/2008      Prior to Admission medications   Not on File    Allergies Lactose intolerance (gi)    Social History Social History   Tobacco Use  . Smoking status: Never Smoker  . Smokeless tobacco: Never Used  Substance Use Topics  . Alcohol use: No  . Drug use: No    Review of Systems Patient denies headaches, rhinorrhea, blurry vision, numbness, shortness of breath, chest pain, edema, cough, abdominal pain, nausea, vomiting, diarrhea, dysuria, fevers, rashes or hallucinations unless otherwise stated above in HPI. ____________________________________________   PHYSICAL EXAM:  VITAL SIGNS: Vitals:   01/14/19 1415 01/14/19 1454  BP:  126/63  Pulse: 88 86   Resp: 15 13  Temp:    SpO2: 99% 99%    Constitutional: Alert and oriented.  Eyes: Conjunctivae are normal.  Head: Atraumatic. Nose: No congestion/rhinnorhea. Mouth/Throat: Mucous membranes are moist.   Neck: No stridor. Painless ROM.  Cardiovascular: Normal rate, regular rhythm. Grossly normal heart sounds.  Good peripheral circulation. Respiratory: Normal respiratory effort.  No retractions. Lungs CTAB. Gastrointestinal: Soft and nontender. No distention. No abdominal bruits. No CVA tenderness. Genitourinary:  Musculoskeletal: No lower extremity tenderness nor edema.  No joint effusions. Neurologic:  Normal speech and language. No gross focal neurologic deficits are appreciated. No facial droop Skin:  Skin is warm, dry and intact. No rash noted. Psychiatric: Mood and affect are normal. Speech and behavior are normal.  ____________________________________________   LABS (all labs ordered are listed, but only abnormal results are displayed)  Results for orders placed or performed during the hospital encounter of 01/14/19 (from the past 24 hour(s))  CBC with Differential/Platelet     Status: Abnormal   Collection Time: 01/14/19 12:52 PM  Result Value Ref Range   WBC 11.2 (H) 4.0 - 10.5 K/uL   RBC 4.14 3.87 - 5.11 MIL/uL   Hemoglobin 12.3 12.0 - 15.0 g/dL   HCT 36.6 36.0 - 46.0 %   MCV 88.4 80.0 - 100.0 fL   MCH 29.7 26.0 - 34.0 pg   MCHC 33.6 30.0 - 36.0 g/dL   RDW 13.2 11.5 - 15.5 %  Platelets 231 150 - 400 K/uL   nRBC 0.0 0.0 - 0.2 %   Neutrophils Relative % 79 %   Neutro Abs 8.8 (H) 1.7 - 7.7 K/uL   Lymphocytes Relative 9 %   Lymphs Abs 1.0 0.7 - 4.0 K/uL   Monocytes Relative 12 %   Monocytes Absolute 1.3 (H) 0.1 - 1.0 K/uL   Eosinophils Relative 0 %   Eosinophils Absolute 0.0 0.0 - 0.5 K/uL   Basophils Relative 0 %   Basophils Absolute 0.0 0.0 - 0.1 K/uL   Immature Granulocytes 0 %   Abs Immature Granulocytes 0.05 0.00 - 0.07 K/uL  Comprehensive metabolic panel      Status: Abnormal   Collection Time: 01/14/19 12:52 PM  Result Value Ref Range   Sodium 135 135 - 145 mmol/L   Potassium 3.2 (L) 3.5 - 5.1 mmol/L   Chloride 99 98 - 111 mmol/L   CO2 22 22 - 32 mmol/L   Glucose, Bld 130 (H) 70 - 99 mg/dL   BUN 29 (H) 8 - 23 mg/dL   Creatinine, Ser 2.01 (H) 0.44 - 1.00 mg/dL   Calcium 8.5 (L) 8.9 - 10.3 mg/dL   Total Protein 7.7 6.5 - 8.1 g/dL   Albumin 4.0 3.5 - 5.0 g/dL   AST 41 15 - 41 U/L   ALT 35 0 - 44 U/L   Alkaline Phosphatase 100 38 - 126 U/L   Total Bilirubin 1.4 (H) 0.3 - 1.2 mg/dL   GFR calc non Af Amer 24 (L) >60 mL/min   GFR calc Af Amer 28 (L) >60 mL/min   Anion gap 14 5 - 15  Lactic acid, plasma     Status: None   Collection Time: 01/14/19 12:52 PM  Result Value Ref Range   Lactic Acid, Venous 1.1 0.5 - 1.9 mmol/L  Urinalysis, Complete w Microscopic     Status: Abnormal   Collection Time: 01/14/19  1:39 PM  Result Value Ref Range   Color, Urine AMBER (A) YELLOW   APPearance TURBID (A) CLEAR   Specific Gravity, Urine 1.021 1.005 - 1.030   pH 5.0 5.0 - 8.0   Glucose, UA NEGATIVE NEGATIVE mg/dL   Hgb urine dipstick SMALL (A) NEGATIVE   Bilirubin Urine NEGATIVE NEGATIVE   Ketones, ur NEGATIVE NEGATIVE mg/dL   Protein, ur 100 (A) NEGATIVE mg/dL   Nitrite NEGATIVE NEGATIVE   Leukocytes,Ua MODERATE (A) NEGATIVE   RBC / HPF 11-20 0 - 5 RBC/hpf   WBC, UA >50 (H) 0 - 5 WBC/hpf   Bacteria, UA RARE (A) NONE SEEN   Squamous Epithelial / LPF 0-5 0 - 5   WBC Clumps PRESENT    Mucus PRESENT    ____________________________________________  EKG My review and personal interpretation at Time: 12:44   Indication: weakness  Rate: 95  Rhythm: sinus Axis: normal Other: rbbb, nonspecific st segment changes from previous tracing, no stemi, abnml ekg ____________________________________________  RADIOLOGY  I personally reviewed all radiographic images ordered to evaluate for the above acute complaints and reviewed radiology reports and  findings.  These findings were personally discussed with the patient.  Please see medical record for radiology report.  ____________________________________________   PROCEDURES  Procedure(s) performed:  Procedures    Critical Care performed: no ____________________________________________   INITIAL IMPRESSION / ASSESSMENT AND PLAN / ED COURSE  Pertinent labs & imaging results that were available during my care of the patient were reviewed by me and considered in my medical decision making (see  chart for details).   DDX: uti, cystitis, pyelo, bacteremia, colitis, pna, dehydration  Lynsi Dooner is a 73 y.o. who presents to the ED with symptoms as described above.  Certainly concerning for dehydration possible sepsis given fevers chills at home with tachycardia and hypotension.  Abdominal exam is soft and benign.  Clinical Course as of Jan 14 1508  Sat Jan 14, 2019  1325 Patient with AKI probable urosepsis.  Will give dose of IV Rocephin.  Will order CT renal study to evaluate for obstructive uropathy.   [PR]  [id number] Fortunately no evidence of obstructive uropathy.  Patient receiving IV bolus with improvement in blood pressure.  Anticipate patient will require admission for IV antibiotics and IV hydration given her AKI.   [PR]    Clinical Course User Index [PR] Merlyn Lot, MD   The patient was evaluated in Emergency Department today for the symptoms described in the history of present illness. He/she was evaluated in the context of the global COVID-19 pandemic, which necessitated consideration that the patient might be at risk for infection with the SARS-CoV-2 virus that causes COVID-19. Institutional protocols and algorithms that pertain to the evaluation of patients at risk for COVID-19 are in a state of rapid change based on information released by regulatory bodies including the CDC and federal and state organizations. These policies and algorithms were followed during the  patient's care in the ED.   As part of my medical decision making, I reviewed the following data within the [facility] notes reviewed and incorporated, Labs reviewed, notes from prior ED visits.   ____________________________________________   FINAL CLINICAL IMPRESSION(S) / ED DIAGNOSES  Final diagnoses:  Dehydration  Cystitis  AKI (acute kidney injury) ([place])  Hypotension due to hypovolemia      NEW MEDICATIONS STARTED DURING THIS VISIT:  New Prescriptions   No medications on file     Note:  This document was prepared using Dragon voice recognition software and may include unintentional dictation errors.    Merlyn Lot, MD 01/14/19 [phone]

## 2019-01-14 NOTE — H&P (Signed)
[facility] [place] NAME: Kendra Vang    MR#:  [id number]  DATE OF BIRTH:  09-23-1946  DATE OF ADMISSION:  01/14/2019  PRIMARY CARE PHYSICIAN: Adin Hector, MD   REQUESTING/REFERRING PHYSICIAN:   CHIEF COMPLAINT:   Chief Complaint  Patient presents with  . Urinary Frequency  . Hypotension    HISTORY OF PRESENT ILLNESS: Kendra Vang  is a 73 y.o. female with a known history of breast cancer, hypertension presented to the emergency room for generalized weakness.  It also has dysuria and cloudy urine.  She had a fever this morning of 101 F.  No complaints of any cough, sore throat.  No history of recent travel, sick contacts at home.  No international visitors at any time.  She was evaluated in the emergency room has UTI and had some low blood pressure.  She was started on IV antibiotics and hospitalist service was consulted.  Patient was worked up with CT kidney stone protocol which did not show any obstructive uropathy.  PAST MEDICAL HISTORY:   Past Medical History:  Diagnosis Date  . Breast cancer ([place])   . Hypertension   . Personal history of radiation therapy     PAST SURGICAL HISTORY:  Past Surgical History:  Procedure Laterality Date  . BREAST BIOPSY Left 2008   positive  . BREAST LUMPECTOMY Left 2008   with radiaton    SOCIAL HISTORY:  Social History   Tobacco Use  . Smoking status: Never Smoker  . Smokeless tobacco: Never Used  Substance Use Topics  . Alcohol use: No    FAMILY HISTORY:  Family History  Problem Relation Age of Onset  . Breast cancer Sister 47    DRUG ALLERGIES:  Allergies  Allergen Reactions  . Lactose Intolerance (Gi)     REVIEW OF SYSTEMS:   CONSTITUTIONAL: Had fever, fatigue and weakness.  EYES: No blurred or double vision.  EARS, NOSE, AND THROAT: No tinnitus or ear pain.  RESPIRATORY: No cough, shortness of breath, wheezing or hemoptysis.  CARDIOVASCULAR: No chest  pain, orthopnea, edema.  GASTROINTESTINAL: No nausea, vomiting, diarrhea or abdominal pain.  GENITOURINARY: Has dysuria, no hematuria.  ENDOCRINE: No polyuria, nocturia,  HEMATOLOGY: No anemia, easy bruising or bleeding SKIN: No rash or lesion. MUSCULOSKELETAL: No joint pain or arthritis.   NEUROLOGIC: No tingling, numbness, weakness.  PSYCHIATRY: No anxiety or depression.   MEDICATIONS AT HOME:  Prior to Admission medications   Not on File      PHYSICAL EXAMINATION:   VITAL SIGNS: Blood pressure 126/71, pulse 87, temperature 98.8 F (37.1 C), temperature source Oral, resp. rate 14, height 5\' 4"  (1.626 m), weight 86.2 kg, SpO2 100 %.  GENERAL:  73 y.o.-year-old patient lying in the bed with no acute distress.  EYES: Pupils equal, round, reactive to light and accommodation. No scleral icterus. Extraocular muscles intact.  HEENT: Head atraumatic, normocephalic. Oropharynx dry and nasopharynx clear.  NECK:  Supple, no jugular venous distention. No thyroid enlargement, no tenderness.  LUNGS: Normal breath sounds bilaterally, no wheezing, rales,rhonchi or crepitation. No use of accessory muscles of respiration.  CARDIOVASCULAR: S1, S2 normal. No murmurs, rubs, or gallops.  ABDOMEN: Soft, nontender, nondistended. Bowel sounds present. No organomegaly or mass.  EXTREMITIES: No pedal edema, cyanosis, or clubbing.  NEUROLOGIC: Cranial nerves II through XII are intact. Muscle strength 5/5 in all extremities. Sensation intact. Gait not checked.  PSYCHIATRIC: The patient is alert and oriented x  3.  SKIN: No obvious rash, lesion, or ulcer.   LABORATORY PANEL:   CBC Recent Labs  Lab 01/14/19 1252  WBC 11.2*  HGB 12.3  HCT 36.6  PLT 231  MCV 88.4  MCH 29.7  MCHC 33.6  RDW 13.2  LYMPHSABS 1.0  MONOABS 1.3*  EOSABS 0.0  BASOSABS 0.0   ------------------------------------------------------------------------------------------------------------------  Chemistries  Recent Labs  Lab  01/14/19 1252  NA 135  K 3.2*  CL 99  CO2 22  GLUCOSE 130*  BUN 29*  CREATININE 2.01*  CALCIUM 8.5*  AST 41  ALT 35  ALKPHOS 100  BILITOT 1.4*   ------------------------------------------------------------------------------------------------------------------ estimated creatinine clearance is 27.3 mL/min (A) (by C-G formula based on SCr of 2.01 mg/dL (H)). ------------------------------------------------------------------------------------------------------------------ No results for input(s): TSH, T4TOTAL, T3FREE, THYROIDAB in the last 72 hours.  Invalid input(s): FREET3   Coagulation profile No results for input(s): INR, PROTIME in the last 168 hours. ------------------------------------------------------------------------------------------------------------------- No results for input(s): DDIMER in the last 72 hours. -------------------------------------------------------------------------------------------------------------------  Cardiac Enzymes No results for input(s): CKMB, TROPONINI, MYOGLOBIN in the last 168 hours.  Invalid input(s): CK ------------------------------------------------------------------------------------------------------------------ Invalid input(s): POCBNP  ---------------------------------------------------------------------------------------------------------------  Urinalysis    Component Value Date/Time   COLORURINE AMBER (A) 01/14/2019 1339   APPEARANCEUR TURBID (A) 01/14/2019 1339   LABSPEC 1.021 01/14/2019 1339   PHURINE 5.0 01/14/2019 1339   GLUCOSEU NEGATIVE 01/14/2019 1339   HGBUR SMALL (A) 01/14/2019 1339   BILIRUBINUR NEGATIVE 01/14/2019 1339   KETONESUR NEGATIVE 01/14/2019 1339   PROTEINUR 100 (A) 01/14/2019 1339   NITRITE NEGATIVE 01/14/2019 1339   LEUKOCYTESUR MODERATE (A) 01/14/2019 1339     RADIOLOGY: Dg Chest Portable 1 View  Result Date: 01/14/2019 CLINICAL DATA:  Hypotension EXAM: PORTABLE CHEST 1 VIEW COMPARISON:   None. FINDINGS: The heart size and mediastinal contours are within normal limits. Both lungs are clear. The visualized skeletal structures are unremarkable. IMPRESSION: No active disease. Electronically Signed   By: Kathreen Devoid   On: 01/14/2019 13:15   Ct Renal Stone Study  Result Date: 01/14/2019 CLINICAL DATA:  Flank pain for 1 week EXAM: CT ABDOMEN AND PELVIS WITHOUT CONTRAST TECHNIQUE: Multidetector CT imaging of the abdomen and pelvis was performed following the standard protocol without IV contrast. COMPARISON:  None. FINDINGS: Lower chest: No acute abnormality. Hepatobiliary: No focal liver abnormality is seen. Status post cholecystectomy. No biliary dilatation. Pancreas: Atrophy of the pancreatic head. No pancreatic ductal dilatation or surrounding inflammatory changes. Spleen: Normal in size without focal abnormality. Adrenals/Urinary Tract: Normal adrenal glands. No urolithiasis or obstructive uropathy. 12 mm hypodense, fluid attenuating left renal mass consistent with a cyst. No other solid renal mass. Normal bladder. Stomach/Bowel: Small hiatal hernia. No evidence of bowel wall thickening, distention, or inflammatory changes. Diverticulosis without evidence of diverticulitis. Vascular/Lymphatic: Abdominal aortic atherosclerosis. Normal caliber abdominal aorta. No lymphadenopathy. Reproductive: Status post hysterectomy. No adnexal masses. Other: No abdominal wall hernia or abnormality. No abdominopelvic ascites. Musculoskeletal: No acute osseous abnormality. No aggressive osseous lesion. Right hip arthroplasty. Atrophy of the right rectus abdominus muscle. IMPRESSION: 1. No urolithiasis or obstructive uropathy. 2.  Aortic Atherosclerosis (ICD10-I70.0). Electronically Signed   By: Kathreen Devoid   On: 01/14/2019 14:20    EKG: Orders placed or performed during the hospital encounter of 01/14/19  . EKG 12-Lead  . EKG 12-Lead    IMPRESSION AND PLAN: 73 year old female patient with a known history  of breast cancer, hypertension presented to the emergency room for generalized weakness.  She also has dysuria and  cloudy urine.    -Systemic inflammatory response syndrome secondary to UTI Admit patient to medical floor IV fluids and antibiotics Follow-up cultures and lactic acid levels  -Acute UTI IV Rocephin antibiotic 1 g daily Follow-up cultures  -Acute kidney injury IV hydration with normal saline Monitor renal function  -Hypokalemia acute Replace potassium aggressively  -DVT prophylaxis subcu heparin All the records are reviewed and case discussed with ED provider. Management plans discussed with the patient, family and they are in agreement.  CODE STATUS:Full code Advance Directive Documentation     Most Recent Value  Type of Advance Directive  Healthcare Power of Attorney  Pre-existing out of facility DNR order (yellow form or pink MOST form)  -  "MOST" Form in Place?  -     TOTAL TIME TAKING CARE OF THIS PATIENT: 52 minutes.    Saundra Shelling M.D on 01/14/2019 at 3:52 PM  Between 7am to 6pm - Pager - [phone]  After 6pm go to [email or web address] - password [email or web address] [facility]  [facility]  Office  [phone]  CC: Primary care physician; Adin Hector, MD

## 2019-01-14 NOTE — ED Notes (Signed)
Patient given water and graham crackers

## 2019-01-14 NOTE — ED Notes (Signed)
EKG given to Dr. Quentin Cornwall.

## 2019-01-14 NOTE — Progress Notes (Signed)
Patient continues to run a fever.  BP elevated.  Dr Estanislado Pandy notified.  Hydralazine ordered.  Home BP meds not being ordered at this time

## 2019-01-14 NOTE — Progress Notes (Signed)
Advanced care plan. Purpose of the Encounter: CODE STATUS Parties in Attendance: Patient Patient's Decision Capacity: Good Subjective/Patient's story: Kendra Vang  is a 73 y.o. female with a known history of breast cancer, hypertension presented to the emergency room for generalized weakness.  It also has dysuria and cloudy urine.  She had a fever this morning of 101 F.  No complaints of any cough, sore throat.  Objective/Medical story Patient was worked up in the emergency room has urinary tract infection.  Needs IV fluids and antibiotics.  Patient also has renal insufficiency that is acute kidney injury.  She needs IV fluids and monitoring renal function. Goals of care determination:  Advance care directives goals of care and treatment plan discussed For now patient wants everything done which includes CPR, intubation ventilator if the need arises CODE STATUS: Full Code Time spent discussing advanced care planning: 16 minutes

## 2019-01-14 NOTE — ED Triage Notes (Addendum)
Pt states weakness x 1 week. States fever of 101 this AM. States has noticed cloudy urine x few days. Also states "my BP has been lower than normal" states dysuria and urgency but can't get much urine out. A&O, in wheelchair. Took 2 tylenol this AM. Took 1 amoxicillin "from when I go to the dentist" this AM.

## 2019-01-15 LAB — BLOOD CULTURE ID PANEL (REFLEXED)
Acinetobacter baumannii: NOT DETECTED
Candida albicans: NOT DETECTED
Candida glabrata: NOT DETECTED
Candida krusei: NOT DETECTED
Candida parapsilosis: NOT DETECTED
Candida tropicalis: NOT DETECTED
Enterobacter cloacae complex: NOT DETECTED
Enterobacteriaceae species: DETECTED — AB
Enterococcus species: NOT DETECTED
Escherichia coli: DETECTED — AB
Haemophilus influenzae: NOT DETECTED
Klebsiella oxytoca: NOT DETECTED
Klebsiella pneumoniae: NOT DETECTED
Listeria monocytogenes: NOT DETECTED
Neisseria meningitidis: NOT DETECTED
Proteus species: NOT DETECTED
Pseudomonas aeruginosa: NOT DETECTED
Serratia marcescens: NOT DETECTED
Staphylococcus aureus (BCID): NOT DETECTED
Staphylococcus species: NOT DETECTED
Streptococcus agalactiae: NOT DETECTED
Streptococcus pneumoniae: NOT DETECTED
Streptococcus pyogenes: NOT DETECTED
Streptococcus species: NOT DETECTED
Vancomycin resistance: NOT DETECTED

## 2019-01-15 LAB — BASIC METABOLIC PANEL
Anion gap: 7 (ref 5–15)
BUN: 26 mg/dL — ABNORMAL HIGH (ref 8–23)
CO2: 22 mmol/L (ref 22–32)
Calcium: 7.9 mg/dL — ABNORMAL LOW (ref 8.9–10.3)
Chloride: 108 mmol/L (ref 98–111)
Creatinine, Ser: 1.59 mg/dL — ABNORMAL HIGH (ref 0.44–1.00)
GFR calc Af Amer: 37 mL/min — ABNORMAL LOW (ref >60)
GFR calc non Af Amer: 32 mL/min — ABNORMAL LOW (ref >60)
Glucose, Bld: 119 mg/dL — ABNORMAL HIGH (ref 70–99)
Potassium: 3.6 mmol/L (ref 3.5–5.1)
Sodium: 137 mmol/L (ref 135–145)

## 2019-01-15 LAB — CBC
HCT: 33.8 % — ABNORMAL LOW (ref 36.0–46.0)
Hemoglobin: 11.1 g/dL — ABNORMAL LOW (ref 12.0–15.0)
MCH: 29.2 pg (ref 26.0–34.0)
MCHC: 32.8 g/dL (ref 30.0–36.0)
MCV: 88.9 fL (ref 80.0–100.0)
Platelets: 209 10*3/uL (ref 150–400)
RBC: 3.8 MIL/uL — ABNORMAL LOW (ref 3.87–5.11)
RDW: 13.1 % (ref 11.5–15.5)
WBC: 8 10*3/uL (ref 4.0–10.5)
nRBC: 0 % (ref 0.0–0.2)

## 2019-01-15 MED ORDER — VITAMIN D 25 MCG (1000 UNIT) PO TABS
1000.0000 [IU] | ORAL_TABLET | Freq: Every day | ORAL | Status: DC
  Administered 2019-01-15 – 2019-01-16 (×2): 1000 [IU] via ORAL
  Filled 2019-01-15 (×2): qty 1

## 2019-01-15 MED ORDER — PANTOPRAZOLE SODIUM 40 MG PO TBEC
40.0000 mg | DELAYED_RELEASE_TABLET | Freq: Every day | ORAL | Status: DC
  Administered 2019-01-15 – 2019-01-16 (×2): 40 mg via ORAL
  Filled 2019-01-15 (×2): qty 1

## 2019-01-15 MED ORDER — KETOROLAC TROMETHAMINE 15 MG/ML IJ SOLN
15.0000 mg | Freq: Four times a day (QID) | INTRAMUSCULAR | Status: DC | PRN
  Filled 2019-01-15: qty 1

## 2019-01-15 MED ORDER — LOSARTAN POTASSIUM 50 MG PO TABS
100.0000 mg | ORAL_TABLET | Freq: Every evening | ORAL | Status: DC
  Administered 2019-01-15 – 2019-01-16 (×2): 100 mg via ORAL
  Filled 2019-01-15 (×2): qty 2

## 2019-01-15 MED ORDER — AZELASTINE HCL 0.1 % NA SOLN
2.0000 | Freq: Two times a day (BID) | NASAL | Status: DC
  Administered 2019-01-15 – 2019-01-16 (×3): 2 via NASAL
  Filled 2019-01-15: qty 30

## 2019-01-15 MED ORDER — CALCIUM CARBONATE 1250 (500 CA) MG PO TABS
1.0000 | ORAL_TABLET | Freq: Every day | ORAL | Status: DC
  Administered 2019-01-16: 500 mg via ORAL
  Filled 2019-01-15 (×2): qty 1

## 2019-01-15 MED ORDER — LOPERAMIDE HCL 2 MG PO CAPS
2.0000 mg | ORAL_CAPSULE | Freq: Four times a day (QID) | ORAL | Status: DC | PRN
  Administered 2019-01-16: 2 mg via ORAL
  Filled 2019-01-15: qty 1

## 2019-01-15 MED ORDER — SODIUM CHLORIDE 0.9 % IV SOLN
1.0000 g | Freq: Two times a day (BID) | INTRAVENOUS | Status: DC
  Administered 2019-01-15 – 2019-01-16 (×2): 1 g via INTRAVENOUS
  Filled 2019-01-15 (×4): qty 1

## 2019-01-15 NOTE — Progress Notes (Signed)
Pharmacy Antibiotic Note  Kendra Vang is a 73 y.o. female admitted on 01/14/2019 with UTI.  Pharmacy has been consulted for meropenem dosing. E. coli in urine. BCID with 1/4 aerobic bottle only - E. coli (KPC negative).  Plan: Meropenem 1 g IV q12h (adjusted for renal function). Pharmacy to continue to monitor for changes in renal function.  Height: 5\' 4"  (162.6 cm) Weight: 190 lb (86.2 kg) IBW/kg (Calculated) : 54.7  Temp (24hrs), Avg:100.3 F (37.9 C), Min:98.6 F (37 C), Max:102.7 F (39.3 C)  Recent Labs  Lab 01/14/19 1252 01/15/19 0316  WBC 11.2* 8.0  CREATININE 2.01* 1.59*  LATICACIDVEN 1.1  --     Estimated Creatinine Clearance: 34.5 mL/min (A) (by C-G formula based on SCr of 1.59 mg/dL (H)).    Allergies  Allergen Reactions  . Lactose Intolerance (Gi)     Antimicrobials this admission: Ceftriaxone 4/4 >> 4/5 Meropenem 4/5 >>   Dose adjustments this admission: NA  Microbiology results: 4/4 BCx: 1/4 (aerobic bottle) E. coli (KPC negative) 4/4 UCx: 60,000 E. coli    Thank you for allowing pharmacy to be a part of this patient's care.  Tawnya Crook, PharmD Pharmacy Resident  01/15/2019 4:15 PM

## 2019-01-15 NOTE — Progress Notes (Signed)
PHARMACY - PHYSICIAN COMMUNICATION CRITICAL VALUE ALERT - BLOOD CULTURE IDENTIFICATION (BCID)  Kendra Vang is an 73 y.o. female who presented to [facility] on 01/14/2019 with sepsis s/t UTI  Assessment:  E. Coli (KPC negative) 1/4 aerobic bottle  Name of physician (or Provider) Contacted: Dr. Verdell Carmine  Current antibiotics: ceftriaxone 1 g q24h  Changes to prescribed antibiotics recommended: meropenem  Tawnya Crook, PharmD Pharmacy Resident  01/15/2019 4:11 PM

## 2019-01-15 NOTE — Progress Notes (Signed)
[place] [facility] [place] NAME: Kendra Vang    MR#:  [id number]  DATE OF BIRTH:  1946-09-30  SUBJECTIVE:   Patient admitted to the hospital secondary sepsis secondary to UTI.  Still feels somewhat lethargic and weak today.  Complaining of a headache.  No other acute events overnight.  Denies any shortness of breath cough congestion or recent travel history.  REVIEW OF SYSTEMS:    Review of Systems  Constitutional: Positive for fever. Negative for chills.  HENT: Negative for congestion and tinnitus.   Eyes: Negative for blurred vision and double vision.  Respiratory: Negative for cough, shortness of breath and wheezing.   Cardiovascular: Negative for chest pain, orthopnea and PND.  Gastrointestinal: Negative for abdominal pain, diarrhea, nausea and vomiting.  Genitourinary: Positive for dysuria and frequency. Negative for hematuria.  Neurological: Negative for dizziness, sensory change and focal weakness.  All other systems reviewed and are negative.   Nutrition: Heart Healthy Tolerating Diet: Yes Tolerating PT: Await Eeval.      DRUG ALLERGIES:   Allergies  Allergen Reactions  . Lactose Intolerance (Gi)     VITALS:  Blood pressure (!) 149/57, pulse 95, temperature 98.6 F (37 C), temperature source Oral, resp. rate 18, height 5' 4"  (1.626 m), weight 86.2 kg, SpO2 98 %.  PHYSICAL EXAMINATION:   Physical Exam  GENERAL:  73 y.o.-year-old patient lying in bed in no acute distress.  EYES: Pupils equal, round, reactive to light and accommodation. No scleral icterus. Extraocular muscles intact.  HEENT: Head atraumatic, normocephalic. Oropharynx and nasopharynx clear.  NECK:  Supple, no jugular venous distention. No thyroid enlargement, no tenderness.  LUNGS: Normal breath sounds bilaterally, no wheezing, rales, rhonchi. No use of accessory muscles of respiration.  CARDIOVASCULAR: S1, S2 normal. No murmurs, rubs, or gallops.   ABDOMEN: Soft, nontender, nondistended. Bowel sounds present. No organomegaly or mass.  EXTREMITIES: No cyanosis, clubbing or edema b/l.    NEUROLOGIC: Cranial nerves II through XII are intact. No focal Motor or sensory deficits b/l.   PSYCHIATRIC: The patient is alert and oriented x 3.  SKIN: No obvious rash, lesion, or ulcer.    LABORATORY PANEL:   CBC Recent Labs  Lab 01/15/19 0316  WBC 8.0  HGB 11.1*  HCT 33.8*  PLT 209   ------------------------------------------------------------------------------------------------------------------  Chemistries  Recent Labs  Lab 01/14/19 1252 01/15/19 0316  NA 135 137  K 3.2* 3.6  CL 99 108  CO2 22 22  GLUCOSE 130* 119*  BUN 29* 26*  CREATININE 2.01* 1.59*  CALCIUM 8.5* 7.9*  AST 41  --   ALT 35  --   ALKPHOS 100  --   BILITOT 1.4*  --    ------------------------------------------------------------------------------------------------------------------  Cardiac Enzymes No results for input(s): TROPONINI in the last 168 hours. ------------------------------------------------------------------------------------------------------------------  RADIOLOGY:  Dg Chest Portable 1 View  Result Date: 01/14/2019 CLINICAL DATA:  Hypotension EXAM: PORTABLE CHEST 1 VIEW COMPARISON:  None. FINDINGS: The heart size and mediastinal contours are within normal limits. Both lungs are clear. The visualized skeletal structures are unremarkable. IMPRESSION: No active disease. Electronically Signed   By: Kathreen Devoid   On: 01/14/2019 13:15   Ct Renal Stone Study  Result Date: 01/14/2019 CLINICAL DATA:  Flank pain for 1 week EXAM: CT ABDOMEN AND PELVIS WITHOUT CONTRAST TECHNIQUE: Multidetector CT imaging of the abdomen and pelvis was performed following the standard protocol without IV contrast. COMPARISON:  None. FINDINGS: Lower chest: No acute abnormality.  Hepatobiliary: No focal liver abnormality is seen. Status post cholecystectomy. No biliary  dilatation. Pancreas: Atrophy of the pancreatic head. No pancreatic ductal dilatation or surrounding inflammatory changes. Spleen: Normal in size without focal abnormality. Adrenals/Urinary Tract: Normal adrenal glands. No urolithiasis or obstructive uropathy. 12 mm hypodense, fluid attenuating left renal mass consistent with a cyst. No other solid renal mass. Normal bladder. Stomach/Bowel: Small hiatal hernia. No evidence of bowel wall thickening, distention, or inflammatory changes. Diverticulosis without evidence of diverticulitis. Vascular/Lymphatic: Abdominal aortic atherosclerosis. Normal caliber abdominal aorta. No lymphadenopathy. Reproductive: Status post hysterectomy. No adnexal masses. Other: No abdominal wall hernia or abnormality. No abdominopelvic ascites. Musculoskeletal: No acute osseous abnormality. No aggressive osseous lesion. Right hip arthroplasty. Atrophy of the right rectus abdominus muscle. IMPRESSION: 1. No urolithiasis or obstructive uropathy. 2.  Aortic Atherosclerosis (ICD10-I70.0). Electronically Signed   By: Kathreen Devoid   On: 01/14/2019 14:20     ASSESSMENT AND PLAN:   73 year old female with past medical history of hypertension, history of breast cancer, GERD, osteoporosis who presented to the hospital due to fever, dysuria.  1.  Sepsis-patient met criteria given the fever, leukocytosis and abnormal urinalysis consistent with a UTI. - Continue IV ceftriaxone for the UTI, continue IV fluids.  Patient is improving.  Presently afebrile, hemodynamically stable.  White cell count has improved.  2.  Urinary tract infection-source of patient's sepsis. -Continue IV Ceftriaxone, follow urine cultures.  3.  Essential hypertension- we will resume patient's losartan.  Continue IV hydralazine as needed.  Hold hydrochlorothiazide for now.  4.  Hypokalemia-improved and resolved with supplementation.  5.  Osteoporosis-continue calcium with vitamin D supplements.  6.  GERD-continue  Protonix.   All the records are reviewed and case discussed with Care Management/Social Worker. Management plans discussed with the patient, family and they are in agreement.  CODE STATUS: Full code  DVT Prophylaxis: Hep SQ  TOTAL TIME TAKING CARE OF THIS PATIENT: 30 minutes.   POSSIBLE D/C IN 1-2 DAYS, DEPENDING ON CLINICAL CONDITION.   Henreitta Leber M.D on 01/15/2019 at 11:30 AM  Between 7am to 6pm - Pager - [phone]  After 6pm go to [email or web address] - Proofreader  [facility]  Office  [phone]  CC: Primary care physician; Adin Hector, MD

## 2019-01-16 LAB — BASIC METABOLIC PANEL
Anion gap: 7 (ref 5–15)
BUN: 18 mg/dL (ref 8–23)
CO2: 24 mmol/L (ref 22–32)
Calcium: 8.3 mg/dL — ABNORMAL LOW (ref 8.9–10.3)
Chloride: 111 mmol/L (ref 98–111)
Creatinine, Ser: 1.38 mg/dL — ABNORMAL HIGH (ref 0.44–1.00)
GFR calc Af Amer: 44 mL/min — ABNORMAL LOW (ref >60)
GFR calc non Af Amer: 38 mL/min — ABNORMAL LOW (ref >60)
Glucose, Bld: 105 mg/dL — ABNORMAL HIGH (ref 70–99)
Potassium: 3.7 mmol/L (ref 3.5–5.1)
Sodium: 142 mmol/L (ref 135–145)

## 2019-01-16 LAB — URINE CULTURE: Culture: 60000 — AB

## 2019-01-16 LAB — CBC
HCT: 34 % — ABNORMAL LOW (ref 36.0–46.0)
Hemoglobin: 10.9 g/dL — ABNORMAL LOW (ref 12.0–15.0)
MCH: 29.2 pg (ref 26.0–34.0)
MCHC: 32.1 g/dL (ref 30.0–36.0)
MCV: 91.2 fL (ref 80.0–100.0)
Platelets: 191 10*3/uL (ref 150–400)
RBC: 3.73 MIL/uL — ABNORMAL LOW (ref 3.87–5.11)
RDW: 13.2 % (ref 11.5–15.5)
WBC: 6.9 10*3/uL (ref 4.0–10.5)
nRBC: 0 % (ref 0.0–0.2)

## 2019-01-16 MED ORDER — PROBIOTIC DAILY PO CAPS: 1.0000 | ORAL_CAPSULE | Freq: Every day | ORAL | 2 refills | Status: AC

## 2019-01-16 MED ORDER — CEPHALEXIN 500 MG PO CAPS: 500.0000 mg | ORAL_CAPSULE | Freq: Three times a day (TID) | ORAL | 0 refills | Status: AC

## 2019-01-16 MED ORDER — HYDROCHLOROTHIAZIDE 25 MG PO TABS
25.0000 mg | ORAL_TABLET | Freq: Every day | ORAL | Status: DC
  Administered 2019-01-16: 25 mg via ORAL
  Filled 2019-01-16: qty 1

## 2019-01-16 MED ORDER — ONDANSETRON 4 MG PO TBDP: 4.0000 mg | ORAL_TABLET | Freq: Three times a day (TID) | ORAL | 0 refills | Status: AC | PRN

## 2019-01-16 NOTE — Discharge Summary (Signed)
[facility] at [place] NAME: Kendra Vang    MR#:  [id number]  DATE OF BIRTH:  1945/11/16  DATE OF ADMISSION:  01/14/2019   ADMITTING PHYSICIAN: Saundra Shelling, MD  DATE OF DISCHARGE: 01/16/19  PRIMARY CARE PHYSICIAN: Tama High III, MD   ADMISSION DIAGNOSIS:   Dehydration [E86.0] AKI (acute kidney injury) ([place]) [N17.9] Cystitis [N30.90] Hypotension due to hypovolemia [I95.89, E86.1]  DISCHARGE DIAGNOSIS:   Active Problems:   AKI (acute kidney injury) ([place])   SECONDARY DIAGNOSIS:   Past Medical History:  Diagnosis Date   Breast cancer ([place])    Hypertension    Personal history of radiation therapy     HOSPITAL COURSE:   73 year old female with past medical history of hypertension, history of breast cancer, GERD, osteoporosis who presented to the hospital due to fever, dysuria.  1.  Sepsis- secondary to E. coli UTI and also E. coli bacteremia. -Received IV Rocephin in the hospital.  Temporarily changed to meropenem after bacteremia was discovered.  But non-ESBL E. coli. -Being discharged on Keflex for 10 days. -Remains afebrile and WBC is improved.  Hemodynamically stable. -Also recommended probiotics with Keflex  2.  Essential hypertension- continue losartan and hydrochlorothiazide  3.  Hypokalemia-improved and resolved with supplementation.  4.  GERD- prilosec  Ambulatory, doing well. Discharge home today   DISCHARGE CONDITIONS:   Guarded  CONSULTS OBTAINED:   none  DRUG ALLERGIES:   Allergies  Allergen Reactions   Lactose Intolerance (Gi)    DISCHARGE MEDICATIONS:   Allergies as of 01/16/2019      Reactions   Lactose Intolerance (gi)       Medication List    TAKE these medications   calcium carbonate 1500 (600 Ca) MG Tabs tablet Commonly known as:  OSCAL Take 1,500 mg by mouth daily.   cephALEXin 500 MG capsule Commonly known as:  Keflex Take 1 capsule (500 mg total) by mouth 3  (three) times daily for 10 days.   cholecalciferol 25 MCG (1000 UT) tablet Commonly known as:  VITAMIN D3 Take 1,000 Units by mouth daily.   hydrochlorothiazide 25 MG tablet Commonly known as:  HYDRODIURIL Take 25 mg by mouth every evening.   losartan 100 MG tablet Commonly known as:  COZAAR Take 100 mg by mouth every evening.   multivitamin with minerals Tabs tablet Take 1 tablet by mouth daily.   omeprazole 40 MG capsule Commonly known as:  PRILOSEC Take 40 mg by mouth every evening.   ondansetron 4 MG disintegrating tablet Commonly known as:  Zofran ODT Take 1 tablet (4 mg total) by mouth every 8 (eight) hours as needed for nausea or vomiting.   Probiotic Daily Caps Take 1 capsule by mouth daily.        DISCHARGE INSTRUCTIONS:   PCP follow-up in 1 to 2 weeks  DIET:   Cardiac diet  ACTIVITY:   Activity as tolerated  OXYGEN:   Home Oxygen: No.  Oxygen Delivery: room air  DISCHARGE LOCATION:   home   If you experience worsening of your admission symptoms, develop shortness of breath, life threatening emergency, suicidal or homicidal thoughts you must seek medical attention immediately by calling 911 or calling your MD immediately  if symptoms less severe.  You Must read complete instructions/literature along with all the possible adverse reactions/side effects for all the Medicines you take and that have been prescribed to you. Take any new Medicines after you have completely understood and  accpet all the possible adverse reactions/side effects.   Please note  You were cared for by a hospitalist during your hospital stay. If you have any questions about your discharge medications or the care you received while you were in the hospital after you are discharged, you can call the unit and asked to speak with the hospitalist on call if the hospitalist that took care of you is not available. Once you are discharged, your primary care physician will handle any  further medical issues. Please note that NO REFILLS for any discharge medications will be authorized once you are discharged, as it is imperative that you return to your primary care physician (or establish a relationship with a primary care physician if you do not have one) for your aftercare needs so that they can reassess your need for medications and monitor your lab values.    On the day of Discharge:  VITAL SIGNS:   Blood pressure 128/60, pulse 88, temperature (!) 97.5 F (36.4 C), temperature source Oral, resp. rate 18, height 5\' 4"  (1.626 m), weight 86.2 kg, SpO2 95 %.  PHYSICAL EXAMINATION:    GENERAL:  73 y.o.-year-old patient lying in bed in no acute distress.  EYES: Pupils equal, round, reactive to light and accommodation. No scleral icterus. Extraocular muscles intact.  HEENT: Head atraumatic, normocephalic. Oropharynx and nasopharynx clear.  NECK:  Supple, no jugular venous distention. No thyroid enlargement, no tenderness.  LUNGS: Normal breath sounds bilaterally, no wheezing, rales, rhonchi. No use of accessory muscles of respiration.  CARDIOVASCULAR: S1, S2 normal. No murmurs, rubs, or gallops.  ABDOMEN: Soft, nontender, nondistended. Bowel sounds present. No organomegaly or mass.  EXTREMITIES: No cyanosis, clubbing or edema b/l.    NEUROLOGIC: Cranial nerves II through XII are intact. No focal Motor or sensory deficits b/l.   PSYCHIATRIC: The patient is alert and oriented x 3.  SKIN: No obvious rash, lesion, or ulcer.   DATA REVIEW:   CBC Recent Labs  Lab 01/16/19 0402  WBC 6.9  HGB 10.9*  HCT 34.0*  PLT 191    Chemistries  Recent Labs  Lab 01/14/19 1252  01/16/19 0402  NA 135   < > 142  K 3.2*   < > 3.7  CL 99   < > 111  CO2 22   < > 24  GLUCOSE 130*   < > 105*  BUN 29*   < > 18  CREATININE 2.01*   < > 1.38*  CALCIUM 8.5*   < > 8.3*  AST 41  --   --   ALT 35  --   --   ALKPHOS 100  --   --   BILITOT 1.4*  --   --    < > = values in this  interval not displayed.     Microbiology Results  Results for orders placed or performed during the hospital encounter of 01/14/19  Blood Culture (routine x 2)     Status: None (Preliminary result)   Collection Time: 01/14/19 12:53 PM  Result Value Ref Range Status   Specimen Description BLOOD LEFT ANTECUBITAL  Final   Special Requests   Final    BOTTLES DRAWN AEROBIC AND ANAEROBIC Blood Culture adequate volume   Culture   Final    NO GROWTH 2 DAYS Performed at [facility], [address] [place], [place] [address]    Report Status PENDING  Incomplete  Urine culture     Status: Abnormal   Collection Time:  01/14/19  1:39 PM  Result Value Ref Range Status   Specimen Description   Final    URINE, RANDOM Performed at [facility], [address] [place], [place] [address]    Special Requests   Final    NONE Performed at [facility], [place], [place] [address]    Culture 60,000 COLONIES/mL ESCHERICHIA COLI (A)  Final   Report Status 01/16/2019 FINAL  Final   Organism ID, Bacteria ESCHERICHIA COLI (A)  Final      Susceptibility   Escherichia coli - MIC*    AMPICILLIN >=32 RESISTANT Resistant     CEFAZOLIN <=4 SENSITIVE Sensitive     CEFTRIAXONE <=1 SENSITIVE Sensitive     CIPROFLOXACIN <=0.25 SENSITIVE Sensitive     GENTAMICIN <=1 SENSITIVE Sensitive     IMIPENEM <=0.25 SENSITIVE Sensitive     NITROFURANTOIN <=16 SENSITIVE Sensitive     TRIMETH/SULFA <=20 SENSITIVE Sensitive     AMPICILLIN/SULBACTAM >=32 RESISTANT Resistant     PIP/TAZO <=4 SENSITIVE Sensitive     Extended ESBL NEGATIVE Sensitive     * 60,000 COLONIES/mL ESCHERICHIA COLI  Blood Culture (routine x 2)     Status: Abnormal (Preliminary result)   Collection Time: 01/14/19  1:40 PM  Result Value Ref Range Status   Specimen Description   Final    BLOOD RIGHT ARM Performed at [facility], [address], [place], [place] [address]    Special Requests    Final    BOTTLES DRAWN AEROBIC AND ANAEROBIC Blood Culture adequate volume Performed at [facility], [address] [place], [place] [address]    Culture  Setup Time   Final    GRAM NEGATIVE RODS AEROBIC BOTTLE ONLY CRITICAL RESULT CALLED TO, READ BACK BY AND VERIFIED WITH: ABBY ELLINGTON @ 1557 ON 01/15/2019 BY CAF    Culture (A)  Final    ESCHERICHIA COLI SUSCEPTIBILITIES TO FOLLOW Performed at [facility], [place] [address], [place],  [address]    Report Status PENDING  Incomplete  Blood Culture ID Panel (Reflexed)     Status: Abnormal   Collection Time: 01/14/19  1:40 PM  Result Value Ref Range Status   Enterococcus species NOT DETECTED NOT DETECTED Final   Vancomycin resistance NOT DETECTED NOT DETECTED Final   Listeria monocytogenes NOT DETECTED NOT DETECTED Final   Staphylococcus species NOT DETECTED NOT DETECTED Final   Staphylococcus aureus (BCID) NOT DETECTED NOT DETECTED Final   Streptococcus species NOT DETECTED NOT DETECTED Final   Streptococcus agalactiae NOT DETECTED NOT DETECTED Final   Streptococcus pneumoniae NOT DETECTED NOT DETECTED Final   Streptococcus pyogenes NOT DETECTED NOT DETECTED Final   Acinetobacter baumannii NOT DETECTED NOT DETECTED Final   Enterobacteriaceae species DETECTED (A) NOT DETECTED Final    Comment: CRITICAL RESULT CALLED TO, READ BACK BY AND VERIFIED WITH: ABBY ELLINGTON @ 9629 ON 01/15/2019 BY CAF Enterobacteriaceae represent a large family of gram-negative bacteria, not a single organism.    Enterobacter cloacae complex NOT DETECTED NOT DETECTED Final   Escherichia coli DETECTED (A) NOT DETECTED Final    Comment: CRITICAL RESULT CALLED TO, READ BACK BY AND VERIFIED WITH: ABBY ELLINGTON @ 5284 ON 01/15/2019 BY CAF    Klebsiella oxytoca NOT DETECTED NOT DETECTED Final   Klebsiella pneumoniae NOT DETECTED NOT DETECTED Final   Proteus species NOT DETECTED NOT DETECTED Final   Serratia marcescens NOT DETECTED NOT  DETECTED Final   Haemophilus influenzae NOT DETECTED  NOT DETECTED Final   Neisseria meningitidis NOT DETECTED NOT DETECTED Final   Pseudomonas aeruginosa NOT DETECTED NOT DETECTED Final   Candida albicans NOT DETECTED NOT DETECTED Final   Candida glabrata NOT DETECTED NOT DETECTED Final   Candida krusei NOT DETECTED NOT DETECTED Final   Candida parapsilosis NOT DETECTED NOT DETECTED Final   Candida tropicalis NOT DETECTED NOT DETECTED Final    Comment: Performed at [facility], [address], [place], [place] [address]    RADIOLOGY:  No results found.   Management plans discussed with the patient, family and they are in agreement.  CODE STATUS:     Code Status Orders  (From admission, onward)         Start     Ordered   01/14/19 1620  Full code  Continuous     01/14/19 1619        Code Status History    This patient has a current code status but no historical code status.    Advance Directive Documentation     Most Recent Value  Type of Advance Directive  Healthcare Power of Attorney  Pre-existing out of facility DNR order (yellow form or pink MOST form)  --  "MOST" Form in Place?  --      TOTAL TIME TAKING CARE OF THIS PATIENT: 38 minutes.    Gladstone Lighter M.D on 01/16/2019 at 3:17 PM  Between 7am to 6pm - Pager - [phone]  After 6pm go to [email or web address] - password [email or web address] [facility]  [facility]  Office  [phone]  CC: Primary care physician; Adin Hector, MD   Note: This dictation was prepared with Dragon dictation along with smaller phrase technology. Any transcriptional errors that result from this process are unintentional.

## 2019-01-16 NOTE — Progress Notes (Signed)
Discharge order received. Patient is alert and oriented. Vital signs stable . No signs of acute distress. Discharge instructions given. Patient verbalized understanding. No other issues noted at this time.   

## 2019-01-17 LAB — CULTURE, BLOOD (ROUTINE X 2): Special Requests: ADEQUATE

## 2019-01-19 LAB — CULTURE, BLOOD (ROUTINE X 2)
Culture: NO GROWTH
Special Requests: ADEQUATE

## 2019-07-11 ENCOUNTER — Other Ambulatory Visit: Payer: Self-pay | Admitting: Internal Medicine

## 2019-07-11 DIAGNOSIS — N183 Chronic kidney disease, stage 3 unspecified: Secondary | ICD-10-CM

## 2019-07-13 ENCOUNTER — Ambulatory Visit
Admission: RE | Admit: 2019-07-13 | Discharge: 2019-07-13 | Disposition: A | Discharge status: Home / Self Care | Payer: BC Managed Care – PPO | Source: Ambulatory Visit | Attending: Internal Medicine | Admitting: Internal Medicine

## 2019-07-13 ENCOUNTER — Other Ambulatory Visit: Payer: Self-pay

## 2019-07-13 DIAGNOSIS — N183 Chronic kidney disease, stage 3 unspecified: Secondary | ICD-10-CM | POA: Diagnosis not present

## 2019-07-18 ENCOUNTER — Ambulatory Visit: Payer: Medicare Other

## 2019-08-01 ENCOUNTER — Other Ambulatory Visit (INDEPENDENT_AMBULATORY_CARE_PROVIDER_SITE_OTHER): Payer: Self-pay | Admitting: Student

## 2019-08-01 DIAGNOSIS — I10 Essential (primary) hypertension: Secondary | ICD-10-CM

## 2019-08-03 ENCOUNTER — Ambulatory Visit (INDEPENDENT_AMBULATORY_CARE_PROVIDER_SITE_OTHER): Payer: BC Managed Care – PPO

## 2019-08-03 ENCOUNTER — Other Ambulatory Visit: Payer: Self-pay

## 2019-08-03 DIAGNOSIS — I10 Essential (primary) hypertension: Secondary | ICD-10-CM

## 2020-10-03 LAB — COLOGUARD: COLOGUARD: NEGATIVE

## 2021-02-12 ENCOUNTER — Other Ambulatory Visit: Payer: Self-pay | Admitting: Physician Assistant

## 2021-02-12 ENCOUNTER — Other Ambulatory Visit: Payer: Self-pay

## 2021-02-12 ENCOUNTER — Ambulatory Visit
Admission: RE | Admit: 2021-02-12 | Discharge: 2021-02-12 | Disposition: A | Discharge status: Home / Self Care | Payer: Medicare Other | Source: Ambulatory Visit | Attending: Physician Assistant | Admitting: Physician Assistant

## 2021-02-12 DIAGNOSIS — R600 Localized edema: Secondary | ICD-10-CM | POA: Diagnosis present

## 2021-02-17 ENCOUNTER — Emergency Department: Payer: Medicare Other

## 2021-02-17 ENCOUNTER — Encounter: Payer: Self-pay | Admitting: Emergency Medicine

## 2021-02-17 ENCOUNTER — Other Ambulatory Visit: Payer: Self-pay

## 2021-02-17 ENCOUNTER — Emergency Department
Admission: EM | Admit: 2021-02-17 | Discharge: 2021-02-17 | Disposition: A | Discharge status: Home / Self Care | Payer: Medicare Other | Attending: Emergency Medicine | Admitting: Emergency Medicine

## 2021-02-17 DIAGNOSIS — N189 Chronic kidney disease, unspecified: Secondary | ICD-10-CM | POA: Insufficient documentation

## 2021-02-17 DIAGNOSIS — R519 Headache, unspecified: Secondary | ICD-10-CM

## 2021-02-17 DIAGNOSIS — I129 Hypertensive chronic kidney disease with stage 1 through stage 4 chronic kidney disease, or unspecified chronic kidney disease: Secondary | ICD-10-CM | POA: Insufficient documentation

## 2021-02-17 DIAGNOSIS — E0590 Thyrotoxicosis, unspecified without thyrotoxic crisis or storm: Secondary | ICD-10-CM

## 2021-02-17 DIAGNOSIS — R946 Abnormal results of thyroid function studies: Secondary | ICD-10-CM | POA: Insufficient documentation

## 2021-02-17 DIAGNOSIS — R002 Palpitations: Secondary | ICD-10-CM | POA: Diagnosis not present

## 2021-02-17 DIAGNOSIS — Z79899 Other long term (current) drug therapy: Secondary | ICD-10-CM | POA: Insufficient documentation

## 2021-02-17 DIAGNOSIS — R42 Dizziness and giddiness: Secondary | ICD-10-CM | POA: Insufficient documentation

## 2021-02-17 DIAGNOSIS — Z20822 Contact with and (suspected) exposure to covid-19: Secondary | ICD-10-CM | POA: Insufficient documentation

## 2021-02-17 DIAGNOSIS — E876 Hypokalemia: Secondary | ICD-10-CM | POA: Diagnosis not present

## 2021-02-17 DIAGNOSIS — R5383 Other fatigue: Secondary | ICD-10-CM | POA: Insufficient documentation

## 2021-02-17 DIAGNOSIS — R7989 Other specified abnormal findings of blood chemistry: Secondary | ICD-10-CM

## 2021-02-17 DIAGNOSIS — R5381 Other malaise: Secondary | ICD-10-CM | POA: Diagnosis not present

## 2021-02-17 DIAGNOSIS — Z853 Personal history of malignant neoplasm of breast: Secondary | ICD-10-CM | POA: Insufficient documentation

## 2021-02-17 LAB — TROPONIN I (HIGH SENSITIVITY)
Troponin I (High Sensitivity): 12 ng/L (ref <18)
Troponin I (High Sensitivity): 15 ng/L (ref <18)

## 2021-02-17 LAB — RESP PANEL BY RT-PCR (FLU A&B, COVID) ARPGX2
Influenza A by PCR: NEGATIVE
Influenza B by PCR: NEGATIVE
SARS Coronavirus 2 by RT PCR: NEGATIVE

## 2021-02-17 LAB — COMPREHENSIVE METABOLIC PANEL
ALT: 25 U/L (ref 0–44)
AST: 19 U/L (ref 15–41)
Albumin: 3.8 g/dL (ref 3.5–5.0)
Alkaline Phosphatase: 109 U/L (ref 38–126)
Anion gap: 9 (ref 5–15)
BUN: 31 mg/dL — ABNORMAL HIGH (ref 8–23)
CO2: 24 mmol/L (ref 22–32)
Calcium: 9.4 mg/dL (ref 8.9–10.3)
Chloride: 106 mmol/L (ref 98–111)
Creatinine, Ser: 1.11 mg/dL — ABNORMAL HIGH (ref 0.44–1.00)
GFR, Estimated: 52 mL/min — ABNORMAL LOW (ref >60)
Glucose, Bld: 111 mg/dL — ABNORMAL HIGH (ref 70–99)
Potassium: 3.1 mmol/L — ABNORMAL LOW (ref 3.5–5.1)
Sodium: 139 mmol/L (ref 135–145)
Total Bilirubin: 0.6 mg/dL (ref 0.3–1.2)
Total Protein: 6.9 g/dL (ref 6.5–8.1)

## 2021-02-17 LAB — CBC WITH DIFFERENTIAL/PLATELET
Abs Immature Granulocytes: 0.04 10*3/uL (ref 0.00–0.07)
Basophils Absolute: 0 10*3/uL (ref 0.0–0.1)
Basophils Relative: 0 %
Eosinophils Absolute: 0.2 10*3/uL (ref 0.0–0.5)
Eosinophils Relative: 3 %
HCT: 34.9 % — ABNORMAL LOW (ref 36.0–46.0)
Hemoglobin: 11.5 g/dL — ABNORMAL LOW (ref 12.0–15.0)
Immature Granulocytes: 1 %
Lymphocytes Relative: 26 %
Lymphs Abs: 1.7 10*3/uL (ref 0.7–4.0)
MCH: 28.5 pg (ref 26.0–34.0)
MCHC: 33 g/dL (ref 30.0–36.0)
MCV: 86.4 fL (ref 80.0–100.0)
Monocytes Absolute: 0.8 10*3/uL (ref 0.1–1.0)
Monocytes Relative: 12 %
Neutro Abs: 3.8 10*3/uL (ref 1.7–7.7)
Neutrophils Relative %: 58 %
Platelets: 292 10*3/uL (ref 150–400)
RBC: 4.04 MIL/uL (ref 3.87–5.11)
RDW: 12.9 % (ref 11.5–15.5)
WBC: 6.6 10*3/uL (ref 4.0–10.5)
nRBC: 0 % (ref 0.0–0.2)

## 2021-02-17 LAB — MAGNESIUM: Magnesium: 1.9 mg/dL (ref 1.7–2.4)

## 2021-02-17 LAB — TSH: TSH: <0.01 u[IU]/mL — ABNORMAL LOW (ref 0.350–4.500)

## 2021-02-17 LAB — T4, FREE: Free T4: 2.42 ng/dL — ABNORMAL HIGH (ref 0.61–1.12)

## 2021-02-17 MED ORDER — DIPHENHYDRAMINE HCL 50 MG/ML IJ SOLN
25.0000 mg | Freq: Once | INTRAMUSCULAR | Status: AC
  Administered 2021-02-17: 25 mg via INTRAVENOUS
  Filled 2021-02-17: qty 1

## 2021-02-17 MED ORDER — POTASSIUM CHLORIDE CRYS ER 20 MEQ PO TBCR
80.0000 meq | EXTENDED_RELEASE_TABLET | Freq: Once | ORAL | Status: AC
  Administered 2021-02-17: 80 meq via ORAL
  Filled 2021-02-17: qty 4

## 2021-02-17 MED ORDER — LACTATED RINGERS IV BOLUS
1000.0000 mL | Freq: Once | INTRAVENOUS | Status: AC
  Administered 2021-02-17: 1000 mL via INTRAVENOUS

## 2021-02-17 MED ORDER — MAGNESIUM SULFATE 2 GM/50ML IV SOLN
2.0000 g | Freq: Once | INTRAVENOUS | Status: AC
  Administered 2021-02-17: 2 g via INTRAVENOUS
  Filled 2021-02-17: qty 50

## 2021-02-17 MED ORDER — PROCHLORPERAZINE EDISYLATE 10 MG/2ML IJ SOLN
10.0000 mg | Freq: Once | INTRAMUSCULAR | Status: AC
  Administered 2021-02-17: 10 mg via INTRAVENOUS
  Filled 2021-02-17: qty 2

## 2021-02-17 MED ORDER — PROPRANOLOL HCL 60 MG PO TABS: 60.0000 mg | ORAL_TABLET | Freq: Three times a day (TID) | ORAL | 0 refills | Status: DC

## 2021-02-17 MED ORDER — PROPRANOLOL HCL 20 MG PO TABS
60.0000 mg | ORAL_TABLET | ORAL | Status: AC
  Administered 2021-02-17: 60 mg via ORAL
  Filled 2021-02-17: qty 3

## 2021-02-17 NOTE — ED Provider Notes (Signed)
[facility] Emergency Department Provider Note  ____________________________________________   Event Date/Time   First MD Initiated Contact with Patient 02/17/21 [phone]     (approximate)  I have reviewed the triage vital signs and the nursing notes.   HISTORY  Chief Complaint Hypertension   HPI Kendra Vang is a 75 y.o. female with a past medical history of breast cancer s/p radiation, HTN, GERD, and CKD who presents to EMS from home for assessment of several symptoms including palpitations, dry mouth, general fatigue and weakness and some dizziness associated with a mild headache that all started earlier this morning.  Patient states she thinks this is related to her blood pressure but is not exactly sure.  She did take a Tylenol prior to arrival but this did not significantly help her headache. She states her dizziness is much better than when she felt earlier this morning.  She denies any vision changes, chest pain, cough, shortness of breath, nausea, vomiting, abdominal pain, back pain, rash or acute focal extremity weakness numbness or tingling.  She does note she recently underwent an ultrasound of her left lower extremity to rule out DVT which was negative as her left lower extremity single more swollen than the right last 2 weeks.  No falls or injuries.  No other acute concerns at this time.         Past Medical History:  Diagnosis Date  . Breast cancer ([place])   . Hypertension   . Personal history of radiation therapy     Patient Active Problem List   Diagnosis Date Noted  . AKI (acute kidney injury) ([place]) 01/14/2019  . URI 07/02/2009  . CARCINOMA IN SITU OF BREAST 12/11/2008  . HYPERTENSION 12/11/2008    Past Surgical History:  Procedure Laterality Date  . BREAST BIOPSY Left 2008   positive  . BREAST LUMPECTOMY Left 2008   with radiaton    Prior to Admission medications   Medication Sig Start Date End Date Taking? Authorizing Provider   propranolol (INDERAL) 60 MG tablet Take 1 tablet (60 mg total) by mouth 3 (three) times daily for 7 days. 02/17/21 02/24/21 Yes Lucrezia Starch, MD  calcium carbonate (OSCAL) 1500 (600 Ca) MG TABS tablet Take 1,500 mg by mouth daily.    [provider]  cholecalciferol (VITAMIN D3) 25 MCG (1000 UT) tablet Take 1,000 Units by mouth daily.    [provider]  hydrochlorothiazide (HYDRODIURIL) 25 MG tablet Take 25 mg by mouth every evening.  09/12/18   [provider]  losartan (COZAAR) 100 MG tablet Take 100 mg by mouth every evening.  11/02/18   [provider]  Multiple Vitamin (MULTIVITAMIN WITH MINERALS) TABS tablet Take 1 tablet by mouth daily.    [provider]  omeprazole (PRILOSEC) 40 MG capsule Take 40 mg by mouth every evening. 10/13/18   [provider]  ondansetron (ZOFRAN ODT) 4 MG disintegrating tablet Take 1 tablet (4 mg total) by mouth every 8 (eight) hours as needed for nausea or vomiting. 01/16/19   Gladstone Lighter, MD  Probiotic Product (PROBIOTIC DAILY) CAPS Take 1 capsule by mouth daily. 01/16/19   Gladstone Lighter, MD    Allergies Lactose intolerance (gi)  Family History  Problem Relation Age of Onset  . Breast cancer Sister 40    Social History Social History   Tobacco Use  . Smoking status: Never Smoker  . Smokeless tobacco: Never Used  Substance Use Topics  . Alcohol use: No  .  Drug use: No    Review of Systems  Review of Systems  Constitutional: Positive for malaise/fatigue. Negative for chills and fever.  HENT: Negative for sore throat.   Eyes: Negative for pain.  Respiratory: Negative for cough and stridor.   Cardiovascular: Positive for palpitations. Negative for chest pain.  Gastrointestinal: Positive for diarrhea ( chronic). Negative for vomiting.  Genitourinary: Negative for dysuria.  Musculoskeletal: Negative for myalgias.  Skin: Negative for rash.  Neurological: Positive for dizziness,  weakness ( generalized) and headaches. Negative for seizures and loss of consciousness.  Psychiatric/Behavioral: Negative for suicidal ideas.  All other systems reviewed and are negative.     ____________________________________________   PHYSICAL EXAM:  VITAL SIGNS: ED Triage Vitals  Enc Vitals Group     BP --      Pulse --      Resp --      Temp --      Temp src --      SpO2 02/17/21 0750 100 %     Weight --      Height --      Head Circumference --      Peak Flow --      Pain Score 02/17/21 0752 0     Pain Loc --      Pain Edu? --      Excl. in [place]? --    Vitals:   02/17/21 1052 02/17/21 1209  BP: (!) 168/64 (!) 165/70  Pulse: (!) 106 76  Resp:  18  Temp:    SpO2:  98%   Physical Exam Vitals and nursing note reviewed.  Constitutional:      General: She is not in acute distress.    Appearance: She is well-developed.  HENT:     Head: Normocephalic and atraumatic.     Right Ear: External ear normal.     Left Ear: External ear normal.     Nose: Nose normal.  Eyes:     Conjunctiva/sclera: Conjunctivae normal.  Cardiovascular:     Rate and Rhythm: Normal rate and regular rhythm.     Heart sounds: No murmur heard.   Pulmonary:     Effort: Pulmonary effort is normal. No respiratory distress.     Breath sounds: Normal breath sounds.  Abdominal:     Palpations: Abdomen is soft.     Tenderness: There is no abdominal tenderness.  Musculoskeletal:     Cervical back: Neck supple.  Skin:    General: Skin is warm and dry.  Neurological:     Mental Status: She is alert and oriented to person, place, and time.  Psychiatric:        Mood and Affect: Mood normal.     Cranial nerves II through XII grossly intact.  No pronator drift.  No finger dysmetria.  Symmetric 5/5 strength of all extremities.  Sensation intact to light touch in all extremities.  Unremarkable unassisted gait.  ____________________________________________   LABS (all labs ordered are listed,  but only abnormal results are displayed)  Labs Reviewed  CBC WITH DIFFERENTIAL/PLATELET - Abnormal; Notable for the following components:      Result Value   Hemoglobin 11.5 (*)    HCT 34.9 (*)    All other components within normal limits  COMPREHENSIVE METABOLIC PANEL - Abnormal; Notable for the following components:   Potassium 3.1 (*)    Glucose, Bld 111 (*)    BUN 31 (*)    Creatinine, Ser 1.11 (*)    GFR,  Estimated 52 (*)    All other components within normal limits  TSH - Abnormal; Notable for the following components:   TSH <0.010 (*)    All other components within normal limits  T4, FREE - Abnormal; Notable for the following components:   Free T4 2.42 (*)    All other components within normal limits  RESP PANEL BY RT-PCR (FLU A&B, COVID) ARPGX2  MAGNESIUM  URINALYSIS, COMPLETE (UACMP) WITH MICROSCOPIC  T3, FREE  TROPONIN I (HIGH SENSITIVITY)  TROPONIN I (HIGH SENSITIVITY)   ____________________________________________  EKG  Sinus rhythm with a ventricular rate of 100, right bundle branch block, unremarkable QTc interval, nonspecific ST changes in anterior lateral and inferior leads.  This ECG is compared to that obtained on 01/14/2019 also showed right bundle branch block with similar ST changes in anterior and lateral leads especially in 1 and aVL L.  Nonspecific change in lead III also appears very similar. ____________________________________________  RADIOLOGY  ED MD interpretation: Chest x-ray unremarkable evidence of edema, thorax, effusion or other clear acute thoracic process.  CT head shows no acute intracranial abnormality.  Official radiology report(s): No results found.  ____________________________________________   PROCEDURES  Procedure(s) performed (including Critical Care):  .1-3 Lead EKG Interpretation Performed by: Lucrezia Starch, MD Authorized by: Lucrezia Starch, MD     Interpretation: normal     ECG rate assessment: normal      Rhythm: sinus rhythm     Ectopy: none     Conduction: normal       ____________________________________________   INITIAL IMPRESSION / ASSESSMENT AND PLAN / ED COURSE      Patient presents with above-stated history exam for assessment of multiple symptoms including mild headache and dizziness that seem to resolve prior to arrival associate with palpitations and of general fatigue and malaise.  On arrival she is hypertensive with BP of 168/74 with otherwise stable vital signs on room air.  She does have a nonfocal neuro exam.  Differential includes symptomatic hypertensive urgency, ACS, symptomatic arrhythmia, anemia, metabolic derangements and endocrine derangements.  No focal deficits to suggest CVA.  No history or exam findings to suggest acute trauma.  No historical or exam features to suggest acute infectious process at this time  ECG is a nonspecific findings but given nonelevated troponins x2 there is no evidence of cardiac ischemia or ACS at this time.  Chest x-ray shows no evidence of heart failure or volume overload.  CT head is unremarkable and no focal deficits on exam to suggest CVA.  CMP remarkable for K of 3.1 which was repleted without any other significant electrode or metabolic derangements.  CBC shows no leukocytosis or acute anemia.  Magnesium is unremarkable.  COVID and flu is negative.  TSH is undetectable and T4 is elevated above 2 concerning for symptomatic hyperthyroidism contributing to patient's elevated blood pressures and symptoms today.  However she is not febrile tachycardic altered tremulous and has no other findings suggestive of thyroid storm at this time.  She was treated with headache cocktail as well as some propranolol on reassessment stated her headache felt better.  Will write short course of propranolol have patient follow-up with endocrinology in addition to her PCP.  Discharged stable condition.  Strict return precautions advised and discussed       ____________________________________________   FINAL CLINICAL IMPRESSION(S) / ED DIAGNOSES  Final diagnoses:  Dizziness  Hypokalemia  Palpitations  Low TSH level  Nonintractable headache, unspecified chronicity pattern, unspecified headache type  Hyperthyroidism    Medications  lactated ringers bolus 1,000 mL (0 mLs Intravenous Stopped 02/17/21 1209)  prochlorperazine (COMPAZINE) injection 10 mg (10 mg Intravenous Given 02/17/21 0820)  diphenhydrAMINE (BENADRYL) injection 25 mg (25 mg Intravenous Given 02/17/21 0821)  magnesium sulfate IVPB 2 g 50 mL (0 g Intravenous Stopped 02/17/21 0925)  potassium chloride SA (KLOR-CON) CR tablet 80 mEq (80 mEq Oral Given 02/17/21 0918)  propranolol (INDERAL) tablet 60 mg (60 mg Oral Given 02/17/21 1052)     ED Discharge Orders         Ordered    propranolol (INDERAL) 60 MG tablet  3 times daily        02/17/21 1217           Note:  This document was prepared using Dragon voice recognition software and may include unintentional dictation errors.   Lucrezia Starch, MD 02/17/21 1225

## 2021-02-17 NOTE — ED Notes (Signed)
Patient to CT at this time

## 2021-02-17 NOTE — ED Triage Notes (Signed)
Patient arrives via [facility] for high blood pressure. Patient has been trying new BP meds with no change. Patient is feeling weak and dizzy. AOx4 at this time.

## 2021-02-18 LAB — T3, FREE: T3, Free: 8.1 pg/mL — ABNORMAL HIGH (ref 2.0–4.4)

## 2021-02-24 ENCOUNTER — Encounter: Payer: Self-pay | Admitting: Endocrinology

## 2021-02-24 ENCOUNTER — Other Ambulatory Visit: Payer: Self-pay

## 2021-02-24 ENCOUNTER — Ambulatory Visit (INDEPENDENT_AMBULATORY_CARE_PROVIDER_SITE_OTHER): Payer: Medicare Other | Admitting: Endocrinology

## 2021-02-24 DIAGNOSIS — E041 Nontoxic single thyroid nodule: Secondary | ICD-10-CM

## 2021-02-24 MED ORDER — METHIMAZOLE 10 MG PO TABS: 20.0000 mg | ORAL_TABLET | Freq: Two times a day (BID) | ORAL | 3 refills | Status: DC

## 2021-02-24 NOTE — Progress Notes (Signed)
Subjective:    Patient ID: Kendra Vang, female    DOB: [birth year], 75 y.o.   MRN: [id number]  HPI Pt is referred by Dr Tamala Julian, for hyperthyroidism.  Pt reports he was dx'ed with hyperthyroidism several weeks ago.  she has never been on therapy for this.  she has never had XRT to the anterior neck, or thyroid surgery. she does not consume kelp or any other non-prescribed thyroid medication.  She has not recently had thyroid imaging (she had aspiration of a thyroid cyst in 2002 and 2017).  Both were benign.  She reports 30 lb weight loss--intentional.  She has palpitations, sweating, and heat intolerance.   Past Medical History:  Diagnosis Date  . Breast cancer ([place])   . Hypertension   . Personal history of radiation therapy     Past Surgical History:  Procedure Laterality Date  . BREAST BIOPSY Left 2008   positive  . BREAST LUMPECTOMY Left 2008   with radiaton    Social History   Socioeconomic History  . Marital status: Married    Spouse name: Not on file  . Number of children: Not on file  . Years of education: Not on file  . Highest education level: Not on file  Occupational History  . Not on file  Tobacco Use  . Smoking status: Never Smoker  . Smokeless tobacco: Never Used  Substance and Sexual Activity  . Alcohol use: No  . Drug use: No  . Sexual activity: Not on file  Other Topics Concern  . Not on file  Social History Narrative  . Not on file   Social Determinants of Health   Financial Resource Strain: Not on file  Food Insecurity: Not on file  Transportation Needs: Not on file  Physical Activity: Not on file  Stress: Not on file  Social Connections: Not on file  Intimate Partner Violence: Not on file    Current Outpatient Medications on File Prior to Visit  Medication Sig Dispense Refill  . calcium carbonate (OSCAL) 1500 (600 Ca) MG TABS tablet Take 1,500 mg by mouth daily.    . cholecalciferol (VITAMIN D3) 25 MCG (1000 UT) tablet Take 1,000 Units by  mouth daily.    . Multiple Vitamin (MULTIVITAMIN WITH MINERALS) TABS tablet Take 1 tablet by mouth daily.    Marland Kitchen omeprazole (PRILOSEC) 40 MG capsule Take 40 mg by mouth every evening.    . propranolol (INDERAL) 60 MG tablet Take 1 tablet (60 mg total) by mouth 3 (three) times daily for 7 days. 21 tablet 0  . hydrochlorothiazide (HYDRODIURIL) 25 MG tablet Take 25 mg by mouth every evening.     Marland Kitchen losartan (COZAAR) 100 MG tablet Take 100 mg by mouth every evening.     . ondansetron (ZOFRAN ODT) 4 MG disintegrating tablet Take 1 tablet (4 mg total) by mouth every 8 (eight) hours as needed for nausea or vomiting. 20 tablet 0  . Probiotic Product (PROBIOTIC DAILY) CAPS Take 1 capsule by mouth daily. 30 capsule 2   No current facility-administered medications on file prior to visit.    Allergies  Allergen Reactions  . Lactose Intolerance (Gi)     Family History  Problem Relation Age of Onset  . Breast cancer Sister 67  . Thyroid disease Neg Hx     BP (!) 150/70 (BP Location: Right Arm, Patient Position: Sitting, Cuff Size: Large)   Pulse 60   Ht 5\' 3"  (1.6 m)   Wt 154 lb 12.8  oz (70.2 kg)   SpO2 99%   BMI 27.42 kg/m     Review of Systems denies weight loss, sob, tremor, and anxiety.    Objective:   Physical Exam VS: see vs page GEN: no distress HEAD: head: no deformity eyes: no periorbital swelling, no proptosis external nose and ears are normal NECK: supple, thyroid is not enlarged CHEST WALL: no deformity LUNGS: clear to auscultation CV: reg rate and rhythm, no murmur.  MUSCULOSKELETAL: gait is normal and steady EXTEMITIES: no deformity.  Trace bilat leg edema NEURO:  readily moves all 4's.  sensation is intact to touch on all 4's.  No tremor SKIN:  Normal texture and temperature.  No rash or suspicious lesion is visible.  Not diaphoretic.   NODES:  None palpable at the neck.   PSYCH: alert, well-oriented.  Does not appear anxious nor depressed.    Lab Results   Component Value Date   TSH <0.010 (L) 02/17/2021   (TSH was normal in 2021).    I have reviewed outside records, and summarized: Pt was noted to have low TSH, and referred here.  She was seen in ER for several sxs. She was found to be hyperthyroid, and was rx'ed inderal      Assessment & Plan:  Hyperthyroidism, new to me, uncontrolled Thyroid cyst, by hx  Patient Instructions  Let's recheck the ultrasound.  you will receive a phone call, about a day and time for an appointment.   I have sent a prescription to your pharmacy, to slow the thyroid.  It is best to never miss the medication.  However, if you do miss it, next best is to double up the next time.   If ever you have fever while taking methimazole, stop it and call us, even if the reason is obvious, because of the risk of a rare side-effect.   Please come back for a follow-up appointment in 2 weeks.

## 2021-02-24 NOTE — Patient Instructions (Addendum)
Let's recheck the ultrasound.  you will receive a phone call, about a day and time for an appointment.   I have sent a prescription to your pharmacy, to slow the thyroid.  It is best to never miss the medication.  However, if you do miss it, next best is to double up the next time.   If ever you have fever while taking methimazole, stop it and call us, even if the reason is obvious, because of the risk of a rare side-effect.   Please come back for a follow-up appointment in 2 weeks.

## 2021-02-28 ENCOUNTER — Other Ambulatory Visit: Payer: Self-pay

## 2021-02-28 ENCOUNTER — Encounter (HOSPITAL_COMMUNITY): Payer: Self-pay | Admitting: Emergency Medicine

## 2021-02-28 ENCOUNTER — Emergency Department
Admission: EM | Admit: 2021-02-28 | Discharge: 2021-02-28 | Disposition: A | Discharge status: Home / Self Care | Payer: Medicare Other | Attending: Emergency Medicine | Admitting: Emergency Medicine

## 2021-02-28 ENCOUNTER — Encounter: Payer: Self-pay | Admitting: Emergency Medicine

## 2021-02-28 ENCOUNTER — Emergency Department (HOSPITAL_COMMUNITY)
Admission: EM | Admit: 2021-02-28 | Discharge: 2021-02-28 | Discharge status: Against Medical Advice | Payer: Medicare Other | Attending: Emergency Medicine | Admitting: Emergency Medicine

## 2021-02-28 DIAGNOSIS — I10 Essential (primary) hypertension: Secondary | ICD-10-CM | POA: Insufficient documentation

## 2021-02-28 DIAGNOSIS — Z853 Personal history of malignant neoplasm of breast: Secondary | ICD-10-CM | POA: Diagnosis not present

## 2021-02-28 DIAGNOSIS — R112 Nausea with vomiting, unspecified: Secondary | ICD-10-CM | POA: Insufficient documentation

## 2021-02-28 DIAGNOSIS — E0590 Thyrotoxicosis, unspecified without thyrotoxic crisis or storm: Secondary | ICD-10-CM | POA: Diagnosis not present

## 2021-02-28 DIAGNOSIS — I152 Hypertension secondary to endocrine disorders: Secondary | ICD-10-CM | POA: Diagnosis not present

## 2021-02-28 DIAGNOSIS — Z79899 Other long term (current) drug therapy: Secondary | ICD-10-CM | POA: Diagnosis not present

## 2021-02-28 DIAGNOSIS — Z5321 Procedure and treatment not carried out due to patient leaving prior to being seen by health care provider: Secondary | ICD-10-CM | POA: Insufficient documentation

## 2021-02-28 LAB — CBC WITH DIFFERENTIAL/PLATELET
Abs Immature Granulocytes: 0.04 10*3/uL (ref 0.00–0.07)
Basophils Absolute: 0 10*3/uL (ref 0.0–0.1)
Basophils Relative: 0 %
Eosinophils Absolute: 0.3 10*3/uL (ref 0.0–0.5)
Eosinophils Relative: 3 %
HCT: 32.3 % — ABNORMAL LOW (ref 36.0–46.0)
Hemoglobin: 10.7 g/dL — ABNORMAL LOW (ref 12.0–15.0)
Immature Granulocytes: 0 %
Lymphocytes Relative: 38 %
Lymphs Abs: 3.5 10*3/uL (ref 0.7–4.0)
MCH: 29 pg (ref 26.0–34.0)
MCHC: 33.1 g/dL (ref 30.0–36.0)
MCV: 87.5 fL (ref 80.0–100.0)
Monocytes Absolute: 0.8 10*3/uL (ref 0.1–1.0)
Monocytes Relative: 9 %
Neutro Abs: 4.6 10*3/uL (ref 1.7–7.7)
Neutrophils Relative %: 50 %
Platelets: 289 10*3/uL (ref 150–400)
RBC: 3.69 MIL/uL — ABNORMAL LOW (ref 3.87–5.11)
RDW: 12.8 % (ref 11.5–15.5)
WBC: 9.2 10*3/uL (ref 4.0–10.5)
nRBC: 0 % (ref 0.0–0.2)

## 2021-02-28 LAB — COMPREHENSIVE METABOLIC PANEL
ALT: 40 U/L (ref 0–44)
AST: 23 U/L (ref 15–41)
Albumin: 3.5 g/dL (ref 3.5–5.0)
Alkaline Phosphatase: 95 U/L (ref 38–126)
Anion gap: 6 (ref 5–15)
BUN: 23 mg/dL (ref 8–23)
CO2: 25 mmol/L (ref 22–32)
Calcium: 8.9 mg/dL (ref 8.9–10.3)
Chloride: 102 mmol/L (ref 98–111)
Creatinine, Ser: 1.28 mg/dL — ABNORMAL HIGH (ref 0.44–1.00)
GFR, Estimated: 44 mL/min — ABNORMAL LOW (ref >60)
Glucose, Bld: 100 mg/dL — ABNORMAL HIGH (ref 70–99)
Potassium: 3.7 mmol/L (ref 3.5–5.1)
Sodium: 133 mmol/L — ABNORMAL LOW (ref 135–145)
Total Bilirubin: 0.5 mg/dL (ref 0.3–1.2)
Total Protein: 6.1 g/dL — ABNORMAL LOW (ref 6.5–8.1)

## 2021-02-28 LAB — T4, FREE: Free T4: 2.94 ng/dL — ABNORMAL HIGH (ref 0.61–1.12)

## 2021-02-28 LAB — TROPONIN I (HIGH SENSITIVITY)
Troponin I (High Sensitivity): 8 ng/L (ref <18)
Troponin I (High Sensitivity): 10 ng/L (ref <18)

## 2021-02-28 LAB — TSH: TSH: <0.01 u[IU]/mL — ABNORMAL LOW (ref 0.350–4.500)

## 2021-02-28 MED ORDER — METHIMAZOLE 10 MG PO TABS: 40.0000 mg | ORAL_TABLET | Freq: Two times a day (BID) | ORAL | 0 refills | Status: DC

## 2021-02-28 MED ORDER — HYDRALAZINE HCL 25 MG PO TABS
50.0000 mg | ORAL_TABLET | Freq: Once | ORAL | Status: AC
  Administered 2021-02-28: 50 mg via ORAL
  Filled 2021-02-28: qty 2

## 2021-02-28 MED ORDER — SODIUM CHLORIDE 0.9 % IV BOLUS
500.0000 mL | Freq: Once | INTRAVENOUS | Status: AC
  Administered 2021-02-28: 500 mL via INTRAVENOUS

## 2021-02-28 MED ORDER — PROPRANOLOL HCL 1 MG/ML IV SOLN
1.0000 mg | Freq: Once | INTRAVENOUS | Status: AC
  Administered 2021-02-28: 1 mg via INTRAVENOUS
  Filled 2021-02-28: qty 1

## 2021-02-28 MED ORDER — ONDANSETRON 4 MG PO TBDP: 4.0000 mg | ORAL_TABLET | Freq: Three times a day (TID) | ORAL | 0 refills | Status: AC | PRN

## 2021-02-28 MED ORDER — ONDANSETRON HCL 4 MG/2ML IJ SOLN
4.0000 mg | Freq: Once | INTRAMUSCULAR | Status: AC
  Administered 2021-02-28: 4 mg via INTRAVENOUS
  Filled 2021-02-28: qty 2

## 2021-02-28 MED ORDER — ONDANSETRON 4 MG PO TBDP
4.0000 mg | ORAL_TABLET | Freq: Once | ORAL | Status: AC
Start: 2021-02-28 — End: 2021-02-28
  Administered 2021-02-28: 4 mg via ORAL
  Filled 2021-02-28: qty 1

## 2021-02-28 NOTE — ED Provider Notes (Signed)
MSE was initiated and I personally evaluated the patient and placed orders (if any) at  12:43 AM on Feb 28, 2021.  Has been seeing primary care for uncontrolled blood pressure over the last 2 weeks. Also seen at [facility], and by Dr. Loanne Drilling. Blood pressure medications added, thyroid medication added. Has been well controlled for 20 years until recently. No chest pain, SOB. C/O headache. "Felt bad" today = off balance, dizzy. No diaphoresis.   Today's Vitals   02/28/21 0032 02/28/21 0035  BP: (!) 239/78   Pulse: 60   Resp: 20   Temp: 98.4 F (36.9 C)   SpO2: 100%   PainSc:  0-No pain   There is no height or weight on file to calculate BMI.  Hypertensive No tachycardia A&O x 4  The patient appears stable so that the remainder of the MSE may be completed by another provider.   Charlann Lange, PA-C 10/62/69 [id number]    Delora Fuel, MD 62/70/35 [phone]

## 2021-02-28 NOTE — ED Triage Notes (Signed)
Pt to ED via POV with c/o elevated BP x 2 week, pt c/o splitting HA, pt states has been seen here and seen her PCP and specialists with adjustments to her meds. Pt states has vomitted several times with her elevated BP.

## 2021-02-28 NOTE — ED Notes (Signed)
Patients husband upset over wait time and states he is taking his wife home

## 2021-02-28 NOTE — Discharge Instructions (Signed)
Please only take 2 pills of your propranolol today.  You may return to taking 3 pills as prescribed tomorrow.

## 2021-02-28 NOTE — ED Triage Notes (Signed)
Patient reports persistent elevated blood pressure for 2 weeks despite adjustments on her medications , BP= 202/93 at home , mild headache .

## 2021-02-28 NOTE — ED Notes (Signed)
Pt assisted to restroom, ambulates with steady gait. Ambulated back to bed, cardiac monitoring resumed. Stretcher locked in low position with call light in reach, family remains at bedside. Pt given crackers and water.

## 2021-02-28 NOTE — ED Provider Notes (Signed)
[facility] Emergency Department Provider Note   ____________________________________________   Event Date/Time   First MD Initiated Contact with Patient 02/28/21 [phone]     (approximate)  I have reviewed the triage vital signs and the nursing notes.   HISTORY  Chief Complaint Emesis and Hypertension    HPI Kendra Vang is a 75 y.o. 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  Patient states that she started this medication approximately 4 days prior to arrival and has been nauseated after taking it.  Patient just started vomiting this morning and has been unable to take any of her normally scheduled medications.  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  Patient also complains of generalized 10/10, nonradiating, global headache.  Patient currently denies any vision changes, tinnitus, difficulty speaking, facial droop, sore throat, chest pain, shortness of breath, abdominal pain, diarrhea, dysuria, or weakness/numbness/paresthesias in any extremity         Past Medical History:  Diagnosis Date  . Breast cancer ([place])   . Hypertension   . Personal history of radiation therapy     Patient Active Problem List   Diagnosis Date Noted  . Thyroid cyst 02/24/2021  . AKI (acute kidney injury) ([place]) 01/14/2019  . URI 07/02/2009  . CARCINOMA IN SITU OF BREAST 12/11/2008  . HYPERTENSION 12/11/2008    Past Surgical History:  Procedure Laterality Date  . BREAST BIOPSY Left 2008   positive  . BREAST LUMPECTOMY Left 2008   with radiaton    Prior to Admission medications   Medication Sig Start Date End Date Taking? Authorizing Provider  methimazole (TAPAZOLE)  10 MG tablet Take 4 tablets (40 mg total) by mouth 2 (two) times daily. 02/28/21 03/30/21 Yes Naaman Plummer, MD  ondansetron (ZOFRAN ODT) 4 MG disintegrating tablet Take 1 tablet (4 mg total) by mouth every 8 (eight) hours as needed for nausea or vomiting. 02/28/21  Yes Corvin Sorbo, Vista Lawman, MD  calcium carbonate (OSCAL) 1500 (600 Ca) MG TABS tablet Take 1,500 mg by mouth daily.    [provider]  cholecalciferol (VITAMIN D3) 25 MCG (1000 UT) tablet Take 1,000 Units by mouth daily.    [provider]  hydrochlorothiazide (HYDRODIURIL) 25 MG tablet Take 25 mg by mouth every evening.  09/12/18   [provider]  losartan (COZAAR) 100 MG tablet Take 100 mg by mouth every evening.  11/02/18   [provider]  methimazole (TAPAZOLE) 10 MG tablet Take 2 tablets (20 mg total) by mouth 2 (two) times daily. 02/24/21   Renato Shin, MD  Multiple Vitamin (MULTIVITAMIN WITH MINERALS) TABS tablet Take 1 tablet by mouth daily.    [provider]  omeprazole (PRILOSEC) 40 MG capsule Take 40 mg by mouth every evening. 10/13/18   [provider]  ondansetron (ZOFRAN ODT) 4 MG disintegrating tablet Take 1 tablet (4 mg total) by mouth every 8 (eight) hours as needed for nausea or vomiting. 01/16/19   Gladstone Lighter, MD  Probiotic Product (PROBIOTIC DAILY) CAPS Take 1 capsule by mouth daily. 01/16/19   Gladstone Lighter, MD  propranolol (INDERAL) 60 MG tablet Take 1 tablet (60 mg total) by mouth 3 (three) times daily for 7 days. 02/17/21 02/24/21  Lucrezia Starch, MD    Allergies Lactose intolerance (gi)  Family History  Problem Relation Age of Onset  . Breast cancer Sister 43  . Thyroid disease Neg Hx     Social History Social History   Tobacco Use  . Smoking status: Never Smoker  . Smokeless tobacco: Never Used  Substance Use Topics  . Alcohol use: No  . Drug use: No    Review of Systems Constitutional: No fever/chills Eyes: No visual changes. ENT: No  sore throat. Cardiovascular: Denies chest pain. Respiratory: Denies shortness of breath. Gastrointestinal: No abdominal pain.  Endorses nausea and vomiting.  No diarrhea. Genitourinary: Negative for dysuria. Musculoskeletal: Negative for acute arthralgias Skin: Negative for rash. Neurological: Positive for headache, negative for weakness/numbness/paresthesias in any extremity Psychiatric: Negative for suicidal ideation/homicidal ideation   ____________________________________________   PHYSICAL EXAM:  VITAL SIGNS: ED Triage Vitals  Enc Vitals Group     BP 02/28/21 0720 (!) 206/69     Pulse Rate 02/28/21 0720 75     Resp 02/28/21 0720 20     Temp 02/28/21 0720 97.8 F (36.6 C)     Temp Source 02/28/21 0720 Oral     SpO2 02/28/21 0720 98 %     Weight 02/28/21 0722 154 lb 12.8 oz (70.2 kg)     Height 02/28/21 0722 5\' 3"  (1.6 m)     Head Circumference --      Peak Flow --      Pain Score 02/28/21 0722 9     Pain Loc --      Pain Edu? --      Excl. in [place]? --    Constitutional: Alert and oriented. Well appearing and in no acute distress. Eyes: Conjunctivae are normal. PERRL. Head: Atraumatic. Nose: No congestion/rhinnorhea. Mouth/Throat: Mucous membranes are moist. Neck: No stridor Cardiovascular: Grossly normal heart sounds.  Good peripheral circulation. Respiratory: Normal respiratory effort.  No retractions. Gastrointestinal: Soft and nontender. No distention. Musculoskeletal: No obvious deformities Neurologic:  Normal speech and language. No gross focal neurologic deficits are appreciated. Skin:  Skin is warm and dry. No rash noted. Psychiatric: Mood and affect are normal. Speech and behavior are normal.  ____________________________________________   LABS (all labs ordered are listed, but only abnormal results are displayed)  Labs Reviewed - No data to display ____________________________________________  EKG  ED ECG REPORT I, Naaman Plummer, the attending  physician, personally viewed and interpreted this ECG.  Date: 02/28/2021 EKG Time: 0728 Rate: 76 Rhythm: normal sinus rhythm QRS Axis: normal Intervals: normal ST/T Wave abnormalities: normal Narrative Interpretation: no evidence of acute ischemia   PROCEDURES  Procedure(s) performed (including Critical Care):  .1-3 Lead EKG Interpretation Performed by: Naaman Plummer, MD Authorized by: Naaman Plummer, MD     Interpretation: normal     ECG rate:  72   ECG rate assessment: normal     Rhythm: sinus rhythm     Ectopy: none     Conduction: normal       ____________________________________________   INITIAL IMPRESSION / ASSESSMENT AND PLAN / ED COURSE  As part of my medical decision making, I reviewed the following data within the [place] notes reviewed and incorporated, Labs reviewed, EKG interpreted, Old chart reviewed, Radiograph reviewed and Notes from prior ED visits reviewed and incorporated        Presents to the emergency department complaining of high blood pressure. Patient is otherwise asymptomatic without confusion, chest pain, hematuria, or  SOB. - Nonadherence to antihypertensive regimen DDx: CV, AMI, heart failure, renal infarction or failure or other end organ damage. 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  Patient's vomiting well controlled and p.o. tolerant prior to discharge.  I spoke with Dr. Loanne Drilling in endocrinology who recommended increasing patient's methimazole to 40 mg twice daily as well as earlier follow-up on the 24th at 1630 Disposition: Discussed with patient their elevated blood pressure and need for close outpatient management of their hypertension. Will provide a prescription for the patients previous antihypertensive medication and arrange for the patient to follow up in a primary care clinic       ____________________________________________   FINAL CLINICAL IMPRESSION(S) / ED  DIAGNOSES  Final diagnoses:  Non-intractable vomiting with nausea, unspecified vomiting type  Hypertension due to endocrine disorder  Hyperthyroidism     ED Discharge Orders         Ordered    ondansetron (ZOFRAN ODT) 4 MG disintegrating tablet  Every 8 hours PRN        02/28/21 1101    methimazole (TAPAZOLE) 10 MG tablet  2 times daily        02/28/21 1101           Note:  This document was prepared using Dragon voice recognition software and may include unintentional dictation errors.   Naaman Plummer, MD 02/28/21 [phone]

## 2021-03-01 LAB — T3: T3, Total: 179 ng/dL (ref 71–180)

## 2021-03-03 ENCOUNTER — Other Ambulatory Visit: Payer: Self-pay

## 2021-03-03 ENCOUNTER — Emergency Department
Admission: EM | Admit: 2021-03-03 | Discharge: 2021-03-03 | Disposition: A | Discharge status: Home / Self Care | Payer: Medicare Other | Attending: Emergency Medicine | Admitting: Emergency Medicine

## 2021-03-03 DIAGNOSIS — Z9012 Acquired absence of left breast and nipple: Secondary | ICD-10-CM | POA: Diagnosis not present

## 2021-03-03 DIAGNOSIS — R531 Weakness: Secondary | ICD-10-CM | POA: Insufficient documentation

## 2021-03-03 DIAGNOSIS — Z853 Personal history of malignant neoplasm of breast: Secondary | ICD-10-CM | POA: Diagnosis not present

## 2021-03-03 DIAGNOSIS — R2243 Localized swelling, mass and lump, lower limb, bilateral: Secondary | ICD-10-CM | POA: Insufficient documentation

## 2021-03-03 DIAGNOSIS — R42 Dizziness and giddiness: Secondary | ICD-10-CM | POA: Diagnosis not present

## 2021-03-03 DIAGNOSIS — Z79899 Other long term (current) drug therapy: Secondary | ICD-10-CM | POA: Insufficient documentation

## 2021-03-03 DIAGNOSIS — R112 Nausea with vomiting, unspecified: Secondary | ICD-10-CM | POA: Insufficient documentation

## 2021-03-03 DIAGNOSIS — I10 Essential (primary) hypertension: Secondary | ICD-10-CM | POA: Insufficient documentation

## 2021-03-03 NOTE — ED Notes (Signed)
Spoke with dr. Alfred Levins regarding pt's symtoms and presenting complaint and desired orders. No new orders received.

## 2021-03-03 NOTE — Discharge Instructions (Addendum)
I recommend taking your telmisartan and propanolol as prescribed.  I recommend taking clonidine 0.1 mg twice daily and follow-up closely with your primary care doctor.

## 2021-03-03 NOTE — ED Triage Notes (Signed)
Pt with history of htn. Pt states was seen on Friday for htn. Pt states she took two clonidine tonight for systolic over 003. Pt denies chest pain, states she felt shaky and had a headache that is resolved.

## 2021-03-03 NOTE — ED Provider Notes (Signed)
[facility] Emergency Department Provider Note  ____________________________________________   Event Date/Time   First MD Initiated Contact with Patient 03/03/21 0222     (approximate)  I have reviewed the triage vital signs and the nursing notes.   HISTORY  Chief Complaint Hypertension    HPI Kendra Vang is a 75 y.o. female with history of hypertension who presents to the emergency department with her son for concerns for high blood pressure.  States this has been an ongoing issue for her for the past few weeks.  States she has been on losartan for years.  States she takes 40 mg twice daily.  She states that she has been tracking her blood pressure and they are very labile.  She states that her PCP Dr. Caryl Comes started her on propanolol 60 mg 3 times a day 2 weeks ago.  She was seen in the emergency department on Friday, May 20 and started on clonidine 0.1 mg 3 times daily.  States she has had a systolic blood pressure of 104 since starting the clonidine but tonight her systolic blood pressures were in the 200s.  States she called her on-call PCP and spoke with Dr. Doy Hutching who recommended that she take half a clonidine tablet at 11 PM.  Then recommended another half at 12 AM.  Then another full tablet at 12:30 AM.  States that that time she was instructed to come to the emergency department because pressures continue to be elevated and she was having headache.  She states headache has now resolved.  She states with her hypertension she has had intermittent nausea, vomiting, bilateral lower extremity swelling, dizziness, generalized weakness.  No chest pain, shortness of breath, numbness, tingling or focal weakness.  No vision changes.  Currently asymptomatic.  She states she is also being followed by endocrinology Dr. Loanne Drilling for concerns for elevated thyroid function.  She states some of her doctors were concerned that this could be causing her elevated blood  pressure.    Patient states that she is on losartan but shows me a bottle for telmisartan 40 mg twice daily.  She reports compliance with her medications.  She states she is scheduled for lab work tomorrow morning.  She is scheduled for a stress test the next day.    Past Medical History:  Diagnosis Date  . Breast cancer ([place])   . Hypertension   . Personal history of radiation therapy     Patient Active Problem List   Diagnosis Date Noted  . Thyroid cyst 02/24/2021  . AKI (acute kidney injury) ([place]) 01/14/2019  . URI 07/02/2009  . CARCINOMA IN SITU OF BREAST 12/11/2008  . HYPERTENSION 12/11/2008    Past Surgical History:  Procedure Laterality Date  . BREAST BIOPSY Left 2008   positive  . BREAST LUMPECTOMY Left 2008   with radiaton    Prior to Admission medications   Medication Sig Start Date End Date Taking? Authorizing Provider  calcium carbonate (OSCAL) 1500 (600 Ca) MG TABS tablet Take 1,500 mg by mouth daily.    [provider]  cholecalciferol (VITAMIN D3) 25 MCG (1000 UT) tablet Take 1,000 Units by mouth daily.    [provider]  hydrochlorothiazide (HYDRODIURIL) 25 MG tablet Take 25 mg by mouth every evening.  09/12/18   [provider]  losartan (COZAAR) 100 MG tablet Take 100 mg by mouth every evening.  11/02/18   [provider]  methimazole (TAPAZOLE) 10 MG tablet Take 2 tablets (20 mg  total) by mouth 2 (two) times daily. 02/24/21   Renato Shin, MD  methimazole (TAPAZOLE) 10 MG tablet Take 4 tablets (40 mg total) by mouth 2 (two) times daily. 02/28/21 03/30/21  Naaman Plummer, MD  Multiple Vitamin (MULTIVITAMIN WITH MINERALS) TABS tablet Take 1 tablet by mouth daily.    [provider]  omeprazole (PRILOSEC) 40 MG capsule Take 40 mg by mouth every evening. 10/13/18   [provider]  ondansetron (ZOFRAN ODT) 4 MG disintegrating tablet Take 1 tablet (4 mg total) by mouth every 8 (eight) hours as needed for nausea  or vomiting. 01/16/19   Gladstone Lighter, MD  ondansetron (ZOFRAN ODT) 4 MG disintegrating tablet Take 1 tablet (4 mg total) by mouth every 8 (eight) hours as needed for nausea or vomiting. 02/28/21   Naaman Plummer, MD  Probiotic Product (PROBIOTIC DAILY) CAPS Take 1 capsule by mouth daily. 01/16/19   Gladstone Lighter, MD  propranolol (INDERAL) 60 MG tablet Take 1 tablet (60 mg total) by mouth 3 (three) times daily for 7 days. 02/17/21 02/24/21  Lucrezia Starch, MD    Allergies Atenolol and Lactose intolerance (gi)  Family History  Problem Relation Age of Onset  . Breast cancer Sister 63  . Thyroid disease Neg Hx     Social History Social History   Tobacco Use  . Smoking status: Never Smoker  . Smokeless tobacco: Never Used  Substance Use Topics  . Alcohol use: No  . Drug use: No    Review of Systems Constitutional: No fever. Eyes: No visual changes. ENT: No sore throat. Cardiovascular: Denies chest pain. Respiratory: Denies shortness of breath. Gastrointestinal: No nausea, vomiting, diarrhea. Genitourinary: Negative for dysuria. Musculoskeletal: Negative for back pain. Skin: Negative for rash. Neurological: Negative for focal weakness or numbness.  ____________________________________________   PHYSICAL EXAM:  VITAL SIGNS: ED Triage Vitals  Enc Vitals Group     BP 03/03/21 0130 (!) 157/66     Pulse Rate 03/03/21 0130 74     Resp 03/03/21 0130 16     Temp 03/03/21 0130 98.1 F (36.7 C)     Temp Source 03/03/21 0130 Oral     SpO2 03/03/21 0130 96 %     Weight 03/03/21 0131 154 lb 5.2 oz (70 kg)     Height 03/03/21 0131 5\' 3"  (1.6 m)     Head Circumference --      Peak Flow --      Pain Score 03/03/21 0131 0     Pain Loc --      Pain Edu? --      Excl. in [place]? --    CONSTITUTIONAL: Alert and oriented and responds appropriately to questions. Well-appearing; well-nourished HEAD: Normocephalic EYES: Conjunctivae clear, pupils appear equal, EOM appear  intact ENT: normal nose; moist mucous membranes NECK: Supple, normal ROM CARD: RRR; S1 and S2 appreciated; no murmurs, no clicks, no rubs, no gallops RESP: Normal chest excursion without splinting or tachypnea; breath sounds clear and equal bilaterally; no wheezes, no rhonchi, no rales, no hypoxia or respiratory distress, speaking full sentences ABD/GI: Normal bowel sounds; non-distended; soft, non-tender, no rebound, no guarding, no peritoneal signs, no hepatosplenomegaly BACK: The back appears normal EXT: Normal ROM in all joints; no deformity noted, no edema; no cyanosis, no calf tenderness or calf swelling, extremities warm and well-perfused SKIN: Normal color for age and race; warm; no rash on exposed skin NEURO: Moves all extremities equally, normal sensation diffusely, cranial nerves II through XII  intact, normal speech PSYCH: The patient's mood and manner are appropriate.  ____________________________________________   LABS (all labs ordered are listed, but only abnormal results are displayed)  Labs Reviewed - No data to display ____________________________________________  EKG   ____________________________________________  RADIOLOGY I, Mickenzie Stolar, personally viewed and evaluated these images (plain radiographs) as part of my medical decision making, as well as reviewing the written report by the radiologist.  ED MD interpretation:    Official radiology report(s): No results found.  ____________________________________________   PROCEDURES  Procedure(s) performed (including Critical Care):  Procedures    ____________________________________________   INITIAL IMPRESSION / ASSESSMENT AND PLAN / ED COURSE  As part of my medical decision making, I reviewed the following data within the [place] History obtained from family, Nursing notes reviewed and incorporated, Old chart reviewed and Notes from prior ED visits         Patient here  with hypertension.  I have reviewed her blood pressures over the past 24 hours from home and they appear very labile.  Her son reports that in the past 48 hours she has had a systolic blood pressure as low as 104 and they have gone up as high as 220s.  Currently she is asymptomatic but just seeking guidance on what to do next.  I have offered blood work today to evaluate for signs of endorgan damage but patient and son are comfortable with holding off on this at this time given she has an appointment for labs tomorrow.  I have reviewed her labs from her visit on the 20th and these appear reassuring other than chronic kidney disease which appears stable.  She is not sure if she has ever been evaluated for renal artery stenosis.  I have discussed at length with patient and son that given she is asymptomatic and her blood pressure is currently 160/64 I do not feel it is appropriate for me to be adjusting her blood pressure medication regimen from the ED especially given she has had systolics as low as 229.  I have actually advised her to decrease her clonidine to 0.1 mg twice a day for at least the next 24 hours until she can get in touch with her PCP Dr. Caryl Comes for further recommendations.  We discussed at length return precautions and what symptoms and blood pressure parameters to be aware of.  I think that her anxiety regarding this situation is not helping and she states she is not sleeping well which we have talked about will likely cause her blood pressure to rise.  Her son was hoping that she could be transferred to [facility] to have a thyroid ultrasound done.  Explained to him that this would be an outpatient work-up and at this time she does not meet criteria to need admission to the hospital especially given she has no current symptoms.  After lengthy discussion with patient and son they feel reassured and many of their questions have been answered and they are comfortable with plan to be discharged home and  follow-up with her primary care doctor closely as an outpatient.  They have also requested cardiology referral which I feel is reasonable.  Have provided them with this information as well.  At this time, I do not feel there is any life-threatening condition present. I have reviewed, interpreted and discussed all results (EKG, imaging, lab, urine as appropriate) and exam findings with patient/family. I have reviewed nursing notes and appropriate previous records.  I feel the  patient is safe to be discharged home without further emergent workup and can continue workup as an outpatient as needed. Discussed usual and customary return precautions. Patient/family verbalize understanding and are comfortable with this plan.  Outpatient follow-up has been provided as needed. All questions have been answered.  ____________________________________________   FINAL CLINICAL IMPRESSION(S) / ED DIAGNOSES  Final diagnoses:  Primary hypertension     ED Discharge Orders    None      *Please note:  Raine Blodgett was evaluated in Emergency Department on 03/03/2021 for the symptoms described in the history of present illness. She was evaluated in the context of the global COVID-19 pandemic, which necessitated consideration that the patient might be at risk for infection with the SARS-CoV-2 virus that causes COVID-19. Institutional protocols and algorithms that pertain to the evaluation of patients at risk for COVID-19 are in a state of rapid change based on information released by regulatory bodies including the CDC and federal and state organizations. These policies and algorithms were followed during the patient's care in the ED.  Some ED evaluations and interventions may be delayed as a result of limited staffing during and the pandemic.*   Note:  This document was prepared using Dragon voice recognition software and may include unintentional dictation errors.   Pepe Mineau, Delice Bison, DO 03/03/21 [phone]

## 2021-03-04 ENCOUNTER — Telehealth: Payer: Self-pay | Admitting: Nutrition

## 2021-03-04 ENCOUNTER — Ambulatory Visit (INDEPENDENT_AMBULATORY_CARE_PROVIDER_SITE_OTHER): Payer: Medicare Other | Admitting: Endocrinology

## 2021-03-04 DIAGNOSIS — R519 Headache, unspecified: Secondary | ICD-10-CM | POA: Diagnosis not present

## 2021-03-04 DIAGNOSIS — R61 Generalized hyperhidrosis: Secondary | ICD-10-CM

## 2021-03-04 DIAGNOSIS — E0590 Thyrotoxicosis, unspecified without thyrotoxic crisis or storm: Secondary | ICD-10-CM | POA: Diagnosis not present

## 2021-03-04 NOTE — Telephone Encounter (Signed)
Telephone Advice record from 03/01/21 at 11:22 AM: Call has been given an RX for her Thyroid.  She was sent to the ED yesterday for high blood pressure and her medication was doubled.  Her Systolic PB was 629 this AM  Called internal medicine and they prescribed clonidine and it dropped to 104.  Was told not to take any more.  Most recent SBP 118.  NOw inching up.  Asking what to do when it gets high again.

## 2021-03-04 NOTE — Patient Instructions (Addendum)
Blood tests, and a 24-HR urine test, are requested for you today.  We'll let you know about the results.  It is best to never miss the methimazole.   However, if you do miss it, next best is to double up the next time.   If ever you have fever while taking methimazole, stop it and call us, even if the reason is obvious, because of the risk of a rare side-effect.   Please come back for a follow-up appointment in 1 month.

## 2021-03-04 NOTE — Telephone Encounter (Signed)
Message left for patient to return my call.  

## 2021-03-04 NOTE — Progress Notes (Signed)
Subjective:    Patient ID: Kendra Vang, female    DOB: [birth year], 75 y.o.   MRN: [id number]  HPI Pt returns for f/u of hyperthyroidism (dx'ed 2022; due to severity, high dose Tapazole is initial rx; she had aspiration of a thyroid cyst in 2002 and 2017; both benign).  Pt reports ongoing headache, fatigue, and nausea.  Ondansetron is preventing vomiting.  She is able to keep down the tapazole.   Past Medical History:  Diagnosis Date  . Breast cancer ([place])   . Hypertension   . Personal history of radiation therapy     Past Surgical History:  Procedure Laterality Date  . BREAST BIOPSY Left 2008   positive  . BREAST LUMPECTOMY Left 2008   with radiaton    Social History   Socioeconomic History  . Marital status: Married    Spouse name: Not on file  . Number of children: Not on file  . Years of education: Not on file  . Highest education level: Not on file  Occupational History  . Not on file  Tobacco Use  . Smoking status: Never Smoker  . Smokeless tobacco: Never Used  Substance and Sexual Activity  . Alcohol use: No  . Drug use: No  . Sexual activity: Not on file  Other Topics Concern  . Not on file  Social History Narrative  . Not on file   Social Determinants of Health   Financial Resource Strain: Not on file  Food Insecurity: Not on file  Transportation Needs: Not on file  Physical Activity: Not on file  Stress: Not on file  Social Connections: Not on file  Intimate Partner Violence: Not on file    Current Outpatient Medications on File Prior to Visit  Medication Sig Dispense Refill  . amLODipine (NORVASC) 2.5 MG tablet Take 10 mg by mouth.    . busPIRone (BUSPAR) 7.5 MG tablet Take by mouth.    . calcium carbonate (OSCAL) 1500 (600 Ca) MG TABS tablet Take 1,500 mg by mouth daily.    . cholecalciferol (VITAMIN D3) 25 MCG (1000 UT) tablet Take 1,000 Units by mouth daily.    . methimazole (TAPAZOLE) 10 MG tablet Take 4 tablets (40 mg total) by mouth 2  (two) times daily. 240 tablet 0  . Multiple Vitamin (MULTIVITAMIN WITH MINERALS) TABS tablet Take 1 tablet by mouth daily.    Marland Kitchen omeprazole (PRILOSEC) 40 MG capsule Take 40 mg by mouth every evening.    . ondansetron (ZOFRAN ODT) 4 MG disintegrating tablet Take 1 tablet (4 mg total) by mouth every 8 (eight) hours as needed for nausea or vomiting. 20 tablet 0  . hydrochlorothiazide (HYDRODIURIL) 25 MG tablet Take 25 mg by mouth every evening.     Marland Kitchen losartan (COZAAR) 100 MG tablet Take 100 mg by mouth every evening.     . ondansetron (ZOFRAN ODT) 4 MG disintegrating tablet Take 1 tablet (4 mg total) by mouth every 8 (eight) hours as needed for nausea or vomiting. 20 tablet 0  . Probiotic Product (PROBIOTIC DAILY) CAPS Take 1 capsule by mouth daily. 30 capsule 2  . propranolol (INDERAL) 60 MG tablet Take 1 tablet (60 mg total) by mouth 3 (three) times daily for 7 days. 21 tablet 0   No current facility-administered medications on file prior to visit.    Allergies  Allergen Reactions  . Atenolol   . Lactose Intolerance (Gi)     Family History  Problem Relation Age of Onset  .  Breast cancer Sister 1  . Thyroid disease Neg Hx     BP (!) 160/80 (BP Location: Right Arm, Patient Position: Sitting, Cuff Size: Large)   Pulse 64   Ht 5\' 3"  (1.6 m)   Wt 169 lb 6.4 oz (76.8 kg)   SpO2 99%   BMI 30.01 kg/m    Review of Systems Denies sweating and     Objective:   Physical Exam VITAL SIGNS:  See vs page GENERAL: no distress NECK: There is no palpable thyroid enlargement.  No thyroid nodule is palpable.  No palpable lymphadenopathy at the anterior neck. EXT: 1+ bilat leg edema   Lab Results  Component Value Date   CREATININE 1.28 (H) 02/28/2021   BUN 23 02/28/2021   NA 133 (L) 02/28/2021   K 3.7 02/28/2021   CL 102 02/28/2021   CO2 25 02/28/2021   Lab Results  Component Value Date   TSH <0.01 Repeated and verified X2. (L) 03/04/2021   T3TOTAL 179 02/28/2021      Assessment  & Plan:  Hyperthyroidism: uncontrolled, but this will improve with time.  Please continue the same methimazole.   Recheck labs 2 weeks.  Please come back for a follow-up appointment in 1 month.    Patient Instructions  Blood tests, and a 24-HR urine test, are requested for you today.  We'll let you know about the results.  It is best to never miss the methimazole.   However, if you do miss it, next best is to double up the next time.   If ever you have fever while taking methimazole, stop it and call us, even if the reason is obvious, because of the risk of a rare side-effect.   Please come back for a follow-up appointment in 1 month.

## 2021-03-04 NOTE — Telephone Encounter (Signed)
Keep appt here this afternoon for thyroid.  See PCP for BP.

## 2021-03-04 NOTE — Telephone Encounter (Signed)
Night- Client Telephone advise record done on 02/27/21 @11 :35 PM.  Patient states that she saw Dr. Loanne Drilling on Monday  and was given methiimazole.  She is having High Blood pressure and wants to get in touch with on call.  IT is 190/83, and 205/92 HR 63.  She has a small amount of fluid in left ankle and dull headache.

## 2021-03-05 ENCOUNTER — Ambulatory Visit: Payer: BLUE CROSS/BLUE SHIELD | Admitting: Endocrinology

## 2021-03-05 ENCOUNTER — Other Ambulatory Visit: Payer: Self-pay | Admitting: Endocrinology

## 2021-03-05 DIAGNOSIS — E0590 Thyrotoxicosis, unspecified without thyrotoxic crisis or storm: Secondary | ICD-10-CM

## 2021-03-05 LAB — TSH: TSH: <0.01 u[IU]/mL — ABNORMAL LOW (ref 0.35–4.50)

## 2021-03-05 LAB — T4, FREE: Free T4: 2.48 ng/dL — ABNORMAL HIGH (ref 0.60–1.60)

## 2021-03-05 NOTE — Telephone Encounter (Signed)
Noted. Patient was confirm yesterday before her appointment

## 2021-03-06 ENCOUNTER — Other Ambulatory Visit: Payer: Self-pay | Admitting: Internal Medicine

## 2021-03-06 ENCOUNTER — Other Ambulatory Visit: Payer: Self-pay | Admitting: Endocrinology

## 2021-03-06 DIAGNOSIS — E041 Nontoxic single thyroid nodule: Secondary | ICD-10-CM

## 2021-03-06 DIAGNOSIS — I10 Essential (primary) hypertension: Secondary | ICD-10-CM

## 2021-03-07 ENCOUNTER — Ambulatory Visit
Admission: RE | Admit: 2021-03-07 | Discharge: 2021-03-07 | Disposition: A | Discharge status: Home / Self Care | Payer: Medicare Other | Source: Ambulatory Visit | Attending: Endocrinology | Admitting: Endocrinology

## 2021-03-07 ENCOUNTER — Other Ambulatory Visit: Payer: Medicare Other

## 2021-03-07 ENCOUNTER — Ambulatory Visit
Admission: RE | Admit: 2021-03-07 | Discharge: 2021-03-07 | Disposition: A | Discharge status: Home / Self Care | Payer: Medicare Other | Source: Ambulatory Visit | Attending: Internal Medicine | Admitting: Internal Medicine

## 2021-03-07 ENCOUNTER — Telehealth: Payer: Self-pay | Admitting: Endocrinology

## 2021-03-07 ENCOUNTER — Other Ambulatory Visit: Payer: Self-pay

## 2021-03-07 DIAGNOSIS — R519 Headache, unspecified: Secondary | ICD-10-CM

## 2021-03-07 DIAGNOSIS — I10 Essential (primary) hypertension: Secondary | ICD-10-CM

## 2021-03-07 DIAGNOSIS — E041 Nontoxic single thyroid nodule: Secondary | ICD-10-CM

## 2021-03-07 NOTE — Telephone Encounter (Signed)
Please advise 

## 2021-03-07 NOTE — Telephone Encounter (Signed)
Radiology reached out to pt and recommend a biopsy as well as  recommend a nuclear medicine scan of the lump to see if it is cancer.   Thurmond Butts (son) would like a call back [phone]

## 2021-03-08 ENCOUNTER — Encounter: Payer: Self-pay | Admitting: Endocrinology

## 2021-03-10 NOTE — Telephone Encounter (Signed)
She can do both if she wants.

## 2021-03-12 ENCOUNTER — Ambulatory Visit: Payer: BLUE CROSS/BLUE SHIELD | Admitting: Endocrinology

## 2021-03-12 LAB — METANEPHRINES, URINE, 24 HOUR
METANEPHRINE: 42 mcg/24 h — ABNORMAL LOW (ref 90–315)
METANEPHRINES, TOTAL: 182 mcg/24 h — ABNORMAL LOW (ref 224–832)
NORMETANEPHRINE: 140 mcg/24 h (ref 122–676)
Total Volume: 2000 mL

## 2021-03-12 LAB — CATECHOLAMINES, FRACTIONATED, URINE, 24 HOUR
Calc Total (E+NE): 10 mcg/24 h — ABNORMAL LOW (ref 26–121)
Creatinine, Urine mg/day-CATEUR: 0.4 g/(24.h) — ABNORMAL LOW (ref 0.50–2.15)
Dopamine 24 Hr Urine: 43 mcg/24 h — ABNORMAL LOW (ref 52–480)
Norepinephrine, 24H, Ur: 10 mcg/24 h — ABNORMAL LOW (ref 15–100)
Total Volume: 2000 mL

## 2021-03-12 NOTE — Telephone Encounter (Signed)
Spoke with son Thurmond Butts and he stated that her primary care dr does not think she needs either one so they are just going to hold off for now.

## 2021-03-13 ENCOUNTER — Other Ambulatory Visit (INDEPENDENT_AMBULATORY_CARE_PROVIDER_SITE_OTHER): Payer: Self-pay | Admitting: Family Medicine

## 2021-03-13 DIAGNOSIS — I10 Essential (primary) hypertension: Secondary | ICD-10-CM

## 2021-03-14 LAB — CATECHOLAMINES, FRACTIONATED, PLASMA
Dopamine: 31 pg/mL — ABNORMAL HIGH
Epinephrine: <20 pg/mL
Norepinephrine: 596 pg/mL
Total Catecholamines: 627 pg/mL

## 2021-03-17 ENCOUNTER — Other Ambulatory Visit: Payer: BLUE CROSS/BLUE SHIELD

## 2021-03-19 ENCOUNTER — Encounter: Payer: Self-pay | Admitting: Endocrinology

## 2021-03-20 ENCOUNTER — Ambulatory Visit: Payer: BLUE CROSS/BLUE SHIELD

## 2021-03-26 ENCOUNTER — Telehealth: Payer: Self-pay | Admitting: Endocrinology

## 2021-03-26 NOTE — Telephone Encounter (Signed)
Pt  voiced is stating that she had a reaction to omeprazole (PRILOSEC) 40 MG capsule.  With burning and itching all over body as well as effected liver . Pt is seeing another doctor to have a removal of the tyroid. Pt would like to let Dr.Ellsion know

## 2021-03-26 NOTE — Telephone Encounter (Signed)
PRILOSEC added to drug reaction list

## 2021-04-08 ENCOUNTER — Ambulatory Visit: Payer: BLUE CROSS/BLUE SHIELD | Admitting: Endocrinology

## 2022-04-28 ENCOUNTER — Ambulatory Visit (INDEPENDENT_AMBULATORY_CARE_PROVIDER_SITE_OTHER): Payer: Medicare Other | Admitting: Podiatry

## 2022-04-28 ENCOUNTER — Encounter: Payer: Self-pay | Admitting: Podiatry

## 2022-04-28 ENCOUNTER — Ambulatory Visit: Payer: Medicare Other

## 2022-04-28 DIAGNOSIS — L84 Corns and callosities: Secondary | ICD-10-CM

## 2022-04-28 NOTE — Progress Notes (Signed)
Subjective:  Patient ID: Kendra Vang, female    DOB: 02-10-1946,  MRN: [id number]  Chief Complaint  Patient presents with   Callouses    "There's a spot on each foot that's painful, on both feet."    76 y.o. female presents with the above complaint.  Patient presents with complaint of bilateral fifth metatarsal plantarflexed with underlying porokeratosis.  Patient states pain for touch is progressive gotten worse.  Hurts with ambulation.  She wanted get it evaluated.  She stated hurts with taking every step.  Came out of nowhere.  She has not seen anyone as prior to seeing me for this.   Review of Systems: Negative except as noted in the HPI. Denies N/V/F/Ch.  Past Medical History:  Diagnosis Date   Breast cancer ([place])    Hypertension    Personal history of radiation therapy     Current Outpatient Medications:    amLODipine (NORVASC) 2.5 MG tablet, Take 10 mg by mouth., Disp: , Rfl:    calcium carbonate (OSCAL) 1500 (600 Ca) MG TABS tablet, Take 1,500 mg by mouth daily., Disp: , Rfl:    cholecalciferol (VITAMIN D3) 25 MCG (1000 UT) tablet, Take 1,000 Units by mouth daily., Disp: , Rfl:    hydrochlorothiazide (HYDRODIURIL) 25 MG tablet, Take 25 mg by mouth every evening. , Disp: , Rfl:    losartan (COZAAR) 100 MG tablet, Take 100 mg by mouth every evening. , Disp: , Rfl:    methimazole (TAPAZOLE) 10 MG tablet, Take 4 tablets (40 mg total) by mouth 2 (two) times daily., Disp: 240 tablet, Rfl: 0   Multiple Vitamin (MULTIVITAMIN WITH MINERALS) TABS tablet, Take 1 tablet by mouth daily., Disp: , Rfl:    omeprazole (PRILOSEC) 40 MG capsule, Take 40 mg by mouth every evening., Disp: , Rfl:    ondansetron (ZOFRAN ODT) 4 MG disintegrating tablet, Take 1 tablet (4 mg total) by mouth every 8 (eight) hours as needed for nausea or vomiting., Disp: 20 tablet, Rfl: 0   ondansetron (ZOFRAN ODT) 4 MG disintegrating tablet, Take 1 tablet (4 mg total) by mouth every 8 (eight) hours as needed for  nausea or vomiting., Disp: 20 tablet, Rfl: 0   Probiotic Product (PROBIOTIC DAILY) CAPS, Take 1 capsule by mouth daily., Disp: 30 capsule, Rfl: 2   propranolol (INDERAL) 60 MG tablet, Take 1 tablet (60 mg total) by mouth 3 (three) times daily for 7 days., Disp: 21 tablet, Rfl: 0  Social History   Tobacco Use  Smoking Status Never  Smokeless Tobacco Never    Allergies  Allergen Reactions   Atenolol    Lactose Intolerance (Gi)    Objective:  There were no vitals filed for this visit. There is no height or weight on file to calculate BMI. Constitutional Well developed. Well nourished.  Vascular Dorsalis pedis pulses palpable bilaterally. Posterior tibial pulses palpable bilaterally. Capillary refill normal to all digits.  No cyanosis or clubbing noted. Pedal hair growth normal.  Neurologic Normal speech. Oriented to person, place, and time. Epicritic sensation to light touch grossly present bilaterally.  Dermatologic Bilateral submetatarsal 5 porokeratotic lesions this benign skin lesion with central nucleated core.  Pain on palpation lesion.  No pinpoint bleeding noted upon debridement  Orthopedic: Normal joint ROM without pain or crepitus bilaterally. No visible deformities. No bony tenderness.   Radiographs: None Assessment:   1. Corns and callosities    Plan:  Patient was evaluated and treated and all questions answered.  Bilateral submetatarsal 5 porokeratosis -  All questions and concerns were discussed with patient in extensive detail given the amount of pain that she is having she will benefit from debridement of the lesion.  Using chisel blade handle lesion was debrided down to healthy striated tissue.  No complication noted no pinpoint bleeding noted. -I discussed offloading and shoe gear modification. -If there is no improvement we will discuss more invasive procedures.  No follow-ups on file.

## 2022-06-09 ENCOUNTER — Ambulatory Visit: Payer: Medicare Other | Admitting: Podiatry

## 2022-06-29 ENCOUNTER — Ambulatory Visit (INDEPENDENT_AMBULATORY_CARE_PROVIDER_SITE_OTHER): Payer: Medicare Other | Admitting: Dermatology

## 2022-06-29 ENCOUNTER — Encounter: Payer: Self-pay | Admitting: Dermatology

## 2022-06-29 DIAGNOSIS — Z853 Personal history of malignant neoplasm of breast: Secondary | ICD-10-CM | POA: Diagnosis not present

## 2022-06-29 DIAGNOSIS — Z1283 Encounter for screening for malignant neoplasm of skin: Secondary | ICD-10-CM | POA: Diagnosis not present

## 2022-06-29 DIAGNOSIS — L814 Other melanin hyperpigmentation: Secondary | ICD-10-CM

## 2022-06-29 DIAGNOSIS — L821 Other seborrheic keratosis: Secondary | ICD-10-CM

## 2022-06-29 DIAGNOSIS — L820 Inflamed seborrheic keratosis: Secondary | ICD-10-CM | POA: Diagnosis not present

## 2022-06-29 DIAGNOSIS — D229 Melanocytic nevi, unspecified: Secondary | ICD-10-CM

## 2022-06-29 DIAGNOSIS — D492 Neoplasm of unspecified behavior of bone, soft tissue, and skin: Secondary | ICD-10-CM

## 2022-06-29 DIAGNOSIS — Z86006 Personal history of melanoma in-situ: Secondary | ICD-10-CM

## 2022-06-29 DIAGNOSIS — D485 Neoplasm of uncertain behavior of skin: Secondary | ICD-10-CM

## 2022-06-29 DIAGNOSIS — L578 Other skin changes due to chronic exposure to nonionizing radiation: Secondary | ICD-10-CM

## 2022-06-29 DIAGNOSIS — D1801 Hemangioma of skin and subcutaneous tissue: Secondary | ICD-10-CM

## 2022-06-29 NOTE — Patient Instructions (Addendum)
Wound Care Instructions  Cleanse wound gently with soap and water once a day then pat dry with clean gauze. Apply a thin coat of Petrolatum (petroleum jelly, "Vaseline") over the wound (unless you have an allergy to this). We recommend that you use a new, sterile tube of Vaseline. Do not pick or remove scabs. Do not remove the yellow or white "healing tissue" from the base of the wound.  Cover the wound with fresh, clean, nonstick gauze and secure with paper tape. You may use Band-Aids in place of gauze and tape if the wound is small enough, but would recommend trimming much of the tape off as there is often too much. Sometimes Band-Aids can irritate the skin.  You should call the office for your biopsy report after 1 week if you have not already been contacted.  If you experience any problems, such as abnormal amounts of bleeding, swelling, significant bruising, significant pain, or evidence of infection, please call the office immediately.  FOR ADULT SURGERY PATIENTS: If you need something for pain relief you may take 1 extra strength Tylenol (acetaminophen) AND 2 Ibuprofen (200mg each) together every 4 hours as needed for pain. (do not take these if you are allergic to them or if you have a reason you should not take them.) Typically, you may only need pain medication for 1 to 3 days.     Due to recent changes in healthcare laws, you may see results of your pathology and/or laboratory studies on MyChart before the doctors have had a chance to review them. We understand that in some cases there may be results that are confusing or concerning to you. Please understand that not all results are received at the same time and often the doctors may need to interpret multiple results in order to provide you with the best plan of care or course of treatment. Therefore, we ask that you please give us 2 business days to thoroughly review all your results before contacting the office for clarification. Should  we see a critical lab result, you will be contacted sooner.   If You Need Anything After Your Visit  If you have any questions or concerns for your doctor, please call our main line at [phone] and press option 4 to reach your doctor's medical assistant. If no one answers, please leave a voicemail as directed and we will return your call as soon as possible. Messages left after 4 pm will be answered the following business day.   You may also send us a message via MyChart. We typically respond to MyChart messages within 1-2 business days.  For prescription refills, please ask your pharmacy to contact our office. Our fax number is [phone].  If you have an urgent issue when the clinic is closed that cannot wait until the next business day, you can page your doctor at the number below.    Please note that while we do our best to be available for urgent issues outside of office hours, we are not available 24/7.   If you have an urgent issue and are unable to reach us, you may choose to seek medical care at your doctor's office, retail clinic, urgent care center, or emergency room.  If you have a medical emergency, please immediately call 911 or go to the emergency department.  Pager Numbers  - Dr. Kowalski: [phone]  - Dr. Moye: [phone]  - Dr. Stewart: [phone]  In the event of inclement weather, please call our main line at   [phone] for an update on the status of any delays or closures.  Dermatology Medication Tips: Please keep the boxes that topical medications come in in order to help keep track of the instructions about where and how to use these. Pharmacies typically print the medication instructions only on the boxes and not directly on the medication tubes.   If your medication is too expensive, please contact our office at [phone] option 4 or send us a message through MyChart.   We are unable to tell what your co-pay for medications will be in  advance as this is different depending on your insurance coverage. However, we may be able to find a substitute medication at lower cost or fill out paperwork to get insurance to cover a needed medication.   If a prior authorization is required to get your medication covered by your insurance company, please allow us 1-2 business days to complete this process.  Drug prices often vary depending on where the prescription is filled and some pharmacies may offer cheaper prices.  The website www.goodrx.com contains coupons for medications through different pharmacies. The prices here do not account for what the cost may be with help from insurance (it may be cheaper with your insurance), but the website can give you the price if you did not use any insurance.  - You can print the associated coupon and take it with your prescription to the pharmacy.  - You may also stop by our office during regular business hours and pick up a GoodRx coupon card.  - If you need your prescription sent electronically to a different pharmacy, notify our office through [facility] MyChart or by phone at [phone] option 4.     Si Usted Necesita Algo Despus de Su Visita  Tambin puede enviarnos un mensaje a travs de MyChart. Por lo general respondemos a los mensajes de MyChart en el transcurso de 1 a 2 das hbiles.  Para renovar recetas, por favor pida a su farmacia que se ponga en contacto con nuestra oficina. Nuestro nmero de fax es el [phone].  Si tiene un asunto urgente cuando la clnica est cerrada y que no puede esperar hasta el siguiente da hbil, puede llamar/localizar a su doctor(a) al nmero que aparece a continuacin.   Por favor, tenga en cuenta que aunque hacemos todo lo posible para estar disponibles para asuntos urgentes fuera del horario de oficina, no estamos disponibles las 24 horas del da, los 7 das de la semana.   Si tiene un problema urgente y no puede comunicarse con nosotros, puede  optar por buscar atencin mdica  en el consultorio de su doctor(a), en una clnica privada, en un centro de atencin urgente o en una sala de emergencias.  Si tiene una emergencia mdica, por favor llame inmediatamente al 911 o vaya a la sala de emergencias.  Nmeros de bper  - Dr. Kowalski: [phone]  - Dra. Moye: [phone]  - Dra. Stewart: [phone]  En caso de inclemencias del tiempo, por favor llame a nuestra lnea principal al [phone] para una actualizacin sobre el estado de cualquier retraso o cierre.  Consejos para la medicacin en dermatologa: Por favor, guarde las cajas en las que vienen los medicamentos de uso tpico para ayudarle a seguir las instrucciones sobre dnde y cmo usarlos. Las farmacias generalmente imprimen las instrucciones del medicamento slo en las cajas y no directamente en los tubos del medicamento.   Si su medicamento es muy caro, por favor, pngase en contacto con   nuestra oficina llamando al [phone] y presione la opcin 4 o envenos un mensaje a travs de MyChart.   No podemos decirle cul ser su copago por los medicamentos por adelantado ya que esto es diferente dependiendo de la cobertura de su seguro. Sin embargo, es posible que podamos encontrar un medicamento sustituto a menor costo o llenar un formulario para que el seguro cubra el medicamento que se considera necesario.   Si se requiere una autorizacin previa para que su compaa de seguros cubra su medicamento, por favor permtanos de 1 a 2 das hbiles para completar este proceso.  Los precios de los medicamentos varan con frecuencia dependiendo del lugar de dnde se surte la receta y alguna farmacias pueden ofrecer precios ms baratos.  El sitio web www.goodrx.com tiene cupones para medicamentos de diferentes farmacias. Los precios aqu no tienen en cuenta lo que podra costar con la ayuda del seguro (puede ser ms barato con su seguro), pero el sitio web puede darle el  precio si no utiliz ningn seguro.  - Puede imprimir el cupn correspondiente y llevarlo con su receta a la farmacia.  - Tambin puede pasar por nuestra oficina durante el horario de atencin regular y recoger una tarjeta de cupones de GoodRx.  - Si necesita que su receta se enve electrnicamente a una farmacia diferente, informe a nuestra oficina a travs de MyChart de [facility] o por telfono llamando al [phone] y presione la opcin 4.  

## 2022-06-29 NOTE — Progress Notes (Unsigned)
New Patient Visit  Subjective  Kendra Vang is a 76 y.o. female who presents for the following: Skin Problem (Mole check ). Patient with a history of Melanoma on her nose treated at [facility] Dr Manley Mason. The patient presents for Total-Body Skin Exam (TBSE) for skin cancer screening and mole check.  The patient has spots, moles and lesions to be evaluated, some may be new or changing and the patient has concerns that these could be cancer.   The following portions of the chart were reviewed this encounter and updated as appropriate:   Tobacco  Allergies  Meds  Problems  Med Hx  Surg Hx  Fam Hx     Review of Systems:  No other skin or systemic complaints except as noted in HPI or Assessment and Plan.  Objective  Well appearing patient in no apparent distress; mood and affect are within normal limits.  A full examination was performed including scalp, head, eyes, ears, nose, lips, neck, chest, axillae, abdomen, back, buttocks, bilateral upper extremities, bilateral lower extremities, hands, feet, fingers, toes, fingernails, and toenails. All findings within normal limits unless otherwise noted below.  left mid back above braline 3.5 cm lat to spine 0.5 cm blue gray macule       legs (20) Stuck-on, waxy, tan-brown papules- Discussed benign etiology and prognosis.    Assessment & Plan  Neoplasm of skin left mid back above braline 3.5 cm lat to spine Skin excision  Informed consent: discussed and consent obtained   Timeout: patient name, date of birth, surgical site, and procedure verified   Procedure prep:  Patient was prepped and draped in usual sterile fashion Prep type:  Isopropyl alcohol and povidone-iodine Anesthesia: the lesion was anesthetized in a standard fashion   Anesthetic:  1% lidocaine w/ epinephrine 1-100,000 buffered w/ 8.4% NaHCO3 Instrument used comment:  6 mm punch Hemostasis achieved with: suture and pressure   Hemostasis achieved with comment:   Electrocautery Outcome: patient tolerated procedure well with no complications   Post-procedure details: sterile dressing applied and wound care instructions given   Dressing type: bandage and pressure dressing (Mupirocin)    Specimen 1 - Surgical pathology Differential Diagnosis: R/O Atypia  Check Margins: No  History of breast cancer left breast - No lymphadenopathy - Recommend regular full body skin exams - Recommend daily broad spectrum sunscreen SPF 30+ to sun-exposed areas, reapply every 2 hours as needed.  - Call if any new or changing lesions are noted between office visits   Inflamed seborrheic keratosis x 20 Legs; back Symptomatic, irritating, patient would like treated.  Destruction of lesion - legs; back Complexity: simple   Destruction method: cryotherapy   Informed consent: discussed and consent obtained   Timeout:  patient name, date of birth, surgical site, and procedure verified Lesion destroyed using liquid nitrogen: Yes   Region frozen until ice ball extended beyond lesion: Yes   Outcome: patient tolerated procedure well with no complications   Post-procedure details: wound care instructions given    Lentigines - Scattered tan macules - Due to sun exposure - Benign-appearing, observe - Recommend daily broad spectrum sunscreen SPF 30+ to sun-exposed areas, reapply every 2 hours as needed. - Call for any changes  Seborrheic Keratoses - Stuck-on, waxy, tan-brown papules and/or plaques  - Benign-appearing - Discussed benign etiology and prognosis. - Observe - Call for any changes  Melanocytic Nevi - Tan-brown and/or pink-flesh-colored symmetric macules and papules - Benign appearing on exam today - Observation - Call clinic  for new or changing moles - Recommend daily use of broad spectrum spf 30+ sunscreen to sun-exposed areas.   Hemangiomas - Red papules - Discussed benign nature - Observe - Call for any changes  Actinic Damage - Chronic  condition, secondary to cumulative UV/sun exposure - diffuse scaly erythematous macules with underlying dyspigmentation - Recommend daily broad spectrum sunscreen SPF 30+ to sun-exposed areas, reapply every 2 hours as needed.  - Staying in the shade or wearing long sleeves, sun glasses (UVA+UVB protection) and wide brim hats (4-inch brim around the entire circumference of the hat) are also recommended for sun protection.  - Call for new or changing lesions.  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  - Call if any new or changing lesions are noted between office visits   Skin cancer screening performed today.   Return in about 1 week (around 07/06/2022) for suture removal .  I, Kendra Vang, CMA, am acting as scribe for Sarina Ser, MD .  Documentation: I have reviewed the above documentation for accuracy and completeness, and I agree with the above.  Sarina Ser, MD

## 2022-07-01 ENCOUNTER — Telehealth: Payer: Self-pay

## 2022-07-01 NOTE — Telephone Encounter (Signed)
Discussed biopsy results with pt  °

## 2022-07-01 NOTE — Telephone Encounter (Signed)
-----   Message from Ralene Bathe, MD sent at 07/01/2022  1:01 PM EDT ----- Diagnosis Skin , left mid back above braline 3.5cm lat to spine SEBORRHEIC KERATOSIS, IRRITATED AND TATTOO  Benign keratosis with associated tattoo. No further treatment needed

## 2022-07-06 ENCOUNTER — Ambulatory Visit (INDEPENDENT_AMBULATORY_CARE_PROVIDER_SITE_OTHER): Payer: Medicare Other | Admitting: Dermatology

## 2022-07-06 DIAGNOSIS — L821 Other seborrheic keratosis: Secondary | ICD-10-CM

## 2022-07-06 NOTE — Progress Notes (Signed)
   Follow-Up Visit   Subjective  Kendra Vang is a 76 y.o. female who presents for the following: Suture / Staple Removal (Biopsy proven SEBORRHEIC KERATOSIS, IRRITATED AND TATTOO at the left mid back above braline 3.5cm lat to spine/).  The following portions of the chart were reviewed this encounter and updated as appropriate:   Tobacco  Allergies  Meds  Problems  Med Hx  Surg Hx  Fam Hx     Review of Systems:  No other skin or systemic complaints except as noted in HPI or Assessment and Plan.  Objective  Well appearing patient in no apparent distress; mood and affect are within normal limits.  A focused examination was performed including back. Relevant physical exam findings are noted in the Assessment and Plan.  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  - Discussed pathology results showing SEBORRHEIC KERATOSIS, IRRITATED AND TATTOO  - Scars remodel for a full year. - Patient advised to call with any concerns or if they notice any new or changing lesions.    Return if symptoms worsen or fail to improve.  IMarye Round, CMA, am acting as scribe for Sarina Ser, MD .  Documentation: I have reviewed the above documentation for accuracy and completeness, and I agree with the above.  Sarina Ser, MD

## 2022-07-06 NOTE — Patient Instructions (Signed)
Due to recent changes in healthcare laws, you may see results of your pathology and/or laboratory studies on MyChart before the doctors have had a chance to review them. We understand that in some cases there may be results that are confusing or concerning to you. Please understand that not all results are received at the same time and often the doctors may need to interpret multiple results in order to provide you with the best plan of care or course of treatment. Therefore, we ask that you please give us 2 business days to thoroughly review all your results before contacting the office for clarification. Should we see a critical lab result, you will be contacted sooner.   If You Need Anything After Your Visit  If you have any questions or concerns for your doctor, please call our main line at [phone] and press option 4 to reach your doctor's medical assistant. If no one answers, please leave a voicemail as directed and we will return your call as soon as possible. Messages left after 4 pm will be answered the following business day.   You may also send us a message via MyChart. We typically respond to MyChart messages within 1-2 business days.  For prescription refills, please ask your pharmacy to contact our office. Our fax number is [phone].  If you have an urgent issue when the clinic is closed that cannot wait until the next business day, you can page your doctor at the number below.    Please note that while we do our best to be available for urgent issues outside of office hours, we are not available 24/7.   If you have an urgent issue and are unable to reach us, you may choose to seek medical care at your doctor's office, retail clinic, urgent care center, or emergency room.  If you have a medical emergency, please immediately call 911 or go to the emergency department.  Pager Numbers  - Dr. Kowalski: [phone]  - Dr. Moye: [phone]  - Dr. Stewart:  [phone]  In the event of inclement weather, please call our main line at [phone] for an update on the status of any delays or closures.  Dermatology Medication Tips: Please keep the boxes that topical medications come in in order to help keep track of the instructions about where and how to use these. Pharmacies typically print the medication instructions only on the boxes and not directly on the medication tubes.   If your medication is too expensive, please contact our office at [phone] option 4 or send us a message through MyChart.   We are unable to tell what your co-pay for medications will be in advance as this is different depending on your insurance coverage. However, we may be able to find a substitute medication at lower cost or fill out paperwork to get insurance to cover a needed medication.   If a prior authorization is required to get your medication covered by your insurance company, please allow us 1-2 business days to complete this process.  Drug prices often vary depending on where the prescription is filled and some pharmacies may offer cheaper prices.  The website www.goodrx.com contains coupons for medications through different pharmacies. The prices here do not account for what the cost may be with help from insurance (it may be cheaper with your insurance), but the website can give you the price if you did not use any insurance.  - You can print the associated coupon and take it with   your prescription to the pharmacy.  - You may also stop by our office during regular business hours and pick up a GoodRx coupon card.  - If you need your prescription sent electronically to a different pharmacy, notify our office through [facility] MyChart or by phone at [phone] option 4.     Si Usted Necesita Algo Despus de Su Visita  Tambin puede enviarnos un mensaje a travs de MyChart. Por lo general respondemos a los mensajes de MyChart en el transcurso de 1 a 2  das hbiles.  Para renovar recetas, por favor pida a su farmacia que se ponga en contacto con nuestra oficina. Nuestro nmero de fax es el [phone].  Si tiene un asunto urgente cuando la clnica est cerrada y que no puede esperar hasta el siguiente da hbil, puede llamar/localizar a su doctor(a) al nmero que aparece a continuacin.   Por favor, tenga en cuenta que aunque hacemos todo lo posible para estar disponibles para asuntos urgentes fuera del horario de oficina, no estamos disponibles las 24 horas del da, los 7 das de la semana.   Si tiene un problema urgente y no puede comunicarse con nosotros, puede optar por buscar atencin mdica  en el consultorio de su doctor(a), en una clnica privada, en un centro de atencin urgente o en una sala de emergencias.  Si tiene una emergencia mdica, por favor llame inmediatamente al 911 o vaya a la sala de emergencias.  Nmeros de bper  - Dr. Kowalski: [phone]  - Dra. Moye: [phone]  - Dra. Stewart: [phone]  En caso de inclemencias del tiempo, por favor llame a nuestra lnea principal al [phone] para una actualizacin sobre el estado de cualquier retraso o cierre.  Consejos para la medicacin en dermatologa: Por favor, guarde las cajas en las que vienen los medicamentos de uso tpico para ayudarle a seguir las instrucciones sobre dnde y cmo usarlos. Las farmacias generalmente imprimen las instrucciones del medicamento slo en las cajas y no directamente en los tubos del medicamento.   Si su medicamento es muy caro, por favor, pngase en contacto con nuestra oficina llamando al [phone] y presione la opcin 4 o envenos un mensaje a travs de MyChart.   No podemos decirle cul ser su copago por los medicamentos por adelantado ya que esto es diferente dependiendo de la cobertura de su seguro. Sin embargo, es posible que podamos encontrar un medicamento sustituto a menor costo o llenar un formulario para que el  seguro cubra el medicamento que se considera necesario.   Si se requiere una autorizacin previa para que su compaa de seguros cubra su medicamento, por favor permtanos de 1 a 2 das hbiles para completar este proceso.  Los precios de los medicamentos varan con frecuencia dependiendo del lugar de dnde se surte la receta y alguna farmacias pueden ofrecer precios ms baratos.  El sitio web www.goodrx.com tiene cupones para medicamentos de diferentes farmacias. Los precios aqu no tienen en cuenta lo que podra costar con la ayuda del seguro (puede ser ms barato con su seguro), pero el sitio web puede darle el precio si no utiliz ningn seguro.  - Puede imprimir el cupn correspondiente y llevarlo con su receta a la farmacia.  - Tambin puede pasar por nuestra oficina durante el horario de atencin regular y recoger una tarjeta de cupones de GoodRx.  - Si necesita que su receta se enve electrnicamente a una farmacia diferente, informe a nuestra oficina a travs de MyChart de [facility]   o por telfono llamando al [phone] y presione la opcin 4.  

## 2022-07-07 ENCOUNTER — Encounter: Payer: Self-pay | Admitting: Dermatology

## 2022-07-14 IMAGING — US US EXTREM LOW VENOUS*L*
1 series · 13 of 24 positions shown · non-contrast
Comparison: None.

CLINICAL DATA: 75-year-old female with history of left lower
extremity edema.

EXAM:
LEFT LOWER EXTREMITY VENOUS DOPPLER ULTRASOUND
TECHNIQUE: Gray-scale sonography with graded compression, as well as color
Doppler and duplex ultrasound were performed to evaluate the left
lower extremity deep venous systems from the level of the common
femoral vein and including the common femoral, femoral, profunda
femoral, popliteal and calf veins including the posterior tibial,
peroneal and gastrocnemius veins when visible. Spectral Doppler was
utilized to evaluate flow at rest and with distal augmentation
maneuvers in the common femoral, femoral and popliteal veins. The
contralateral common femoral vein was also evaluated for comparison.

[Series 1: us venous img lower uni left (dvt) · portal-venous · 13 of 33 slices shown]
[im 1/33]
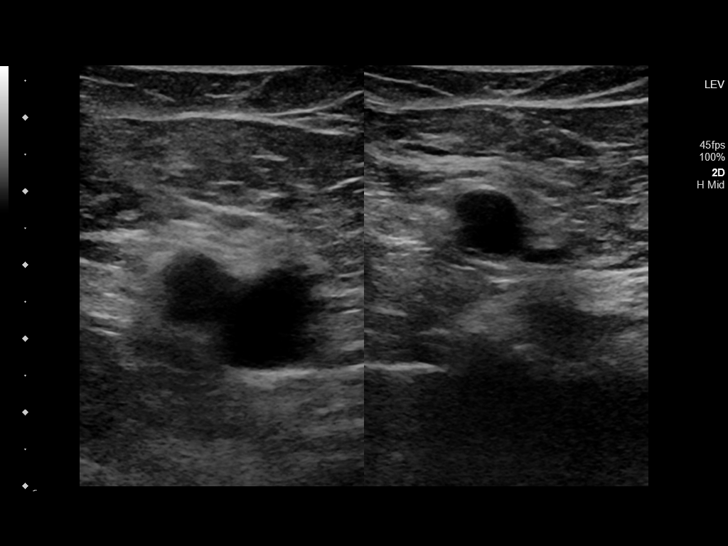
[im 3/33]
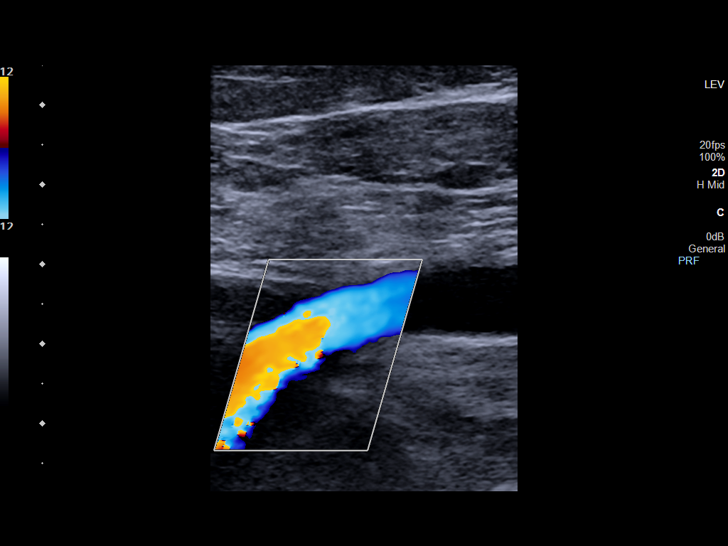
[im 6/33]
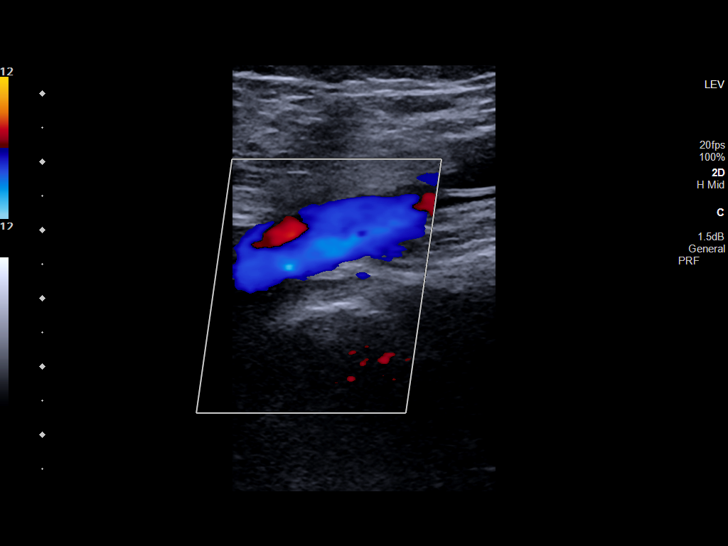
[im 9/33]
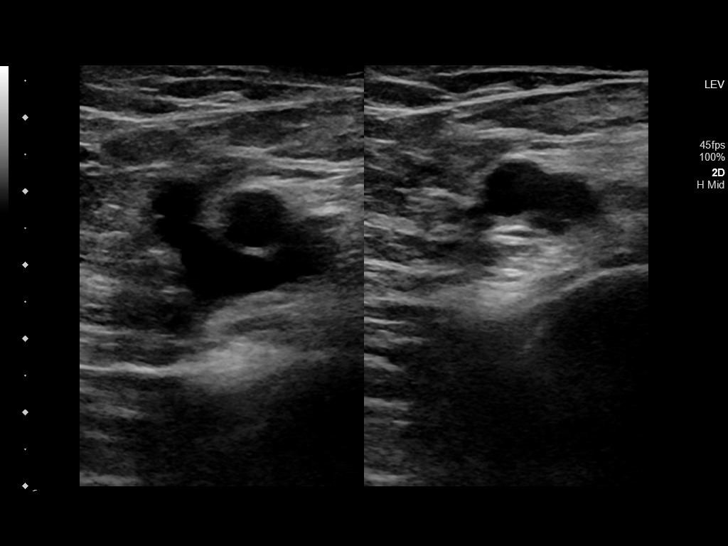
[im 12/33]
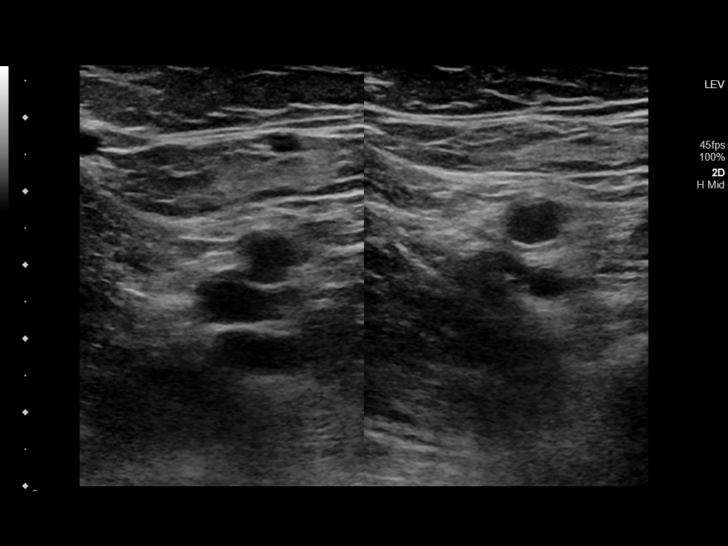
[im 14/33]
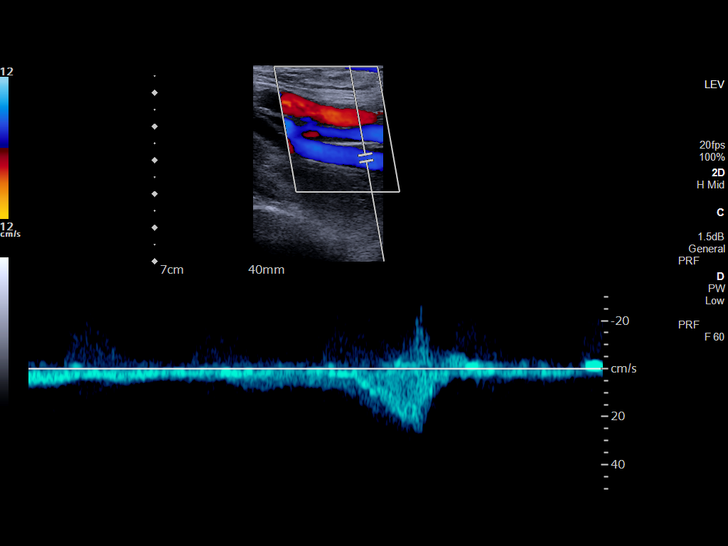
[im 17/33]
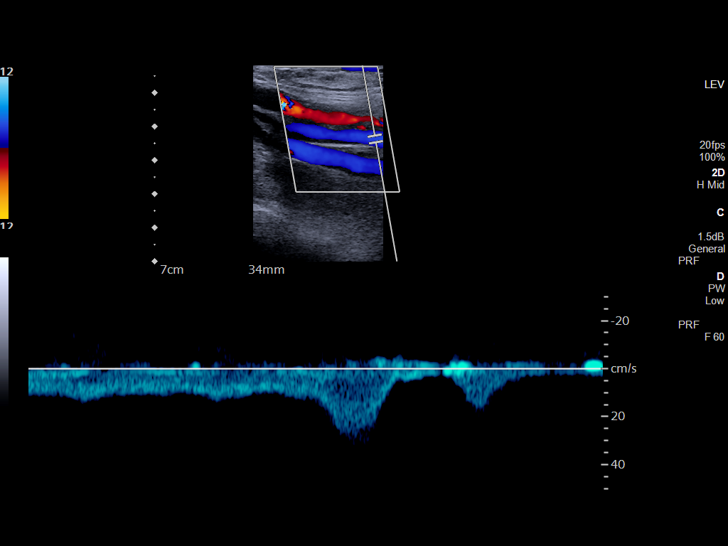
[im 19/33]
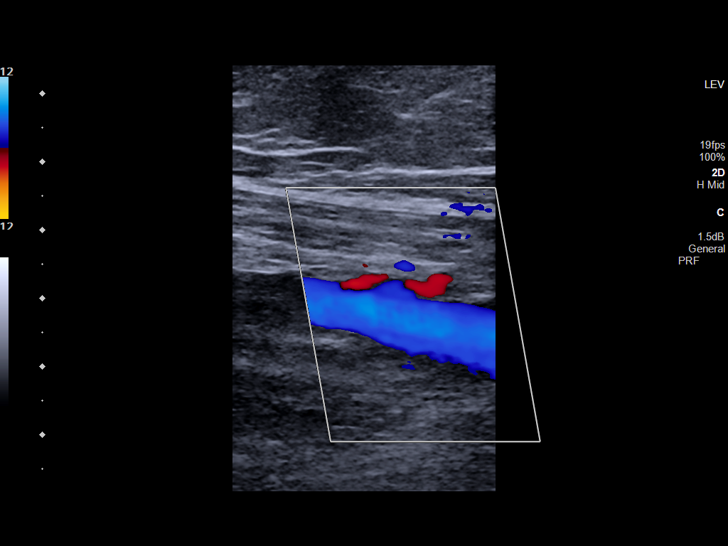
[im 21/33]
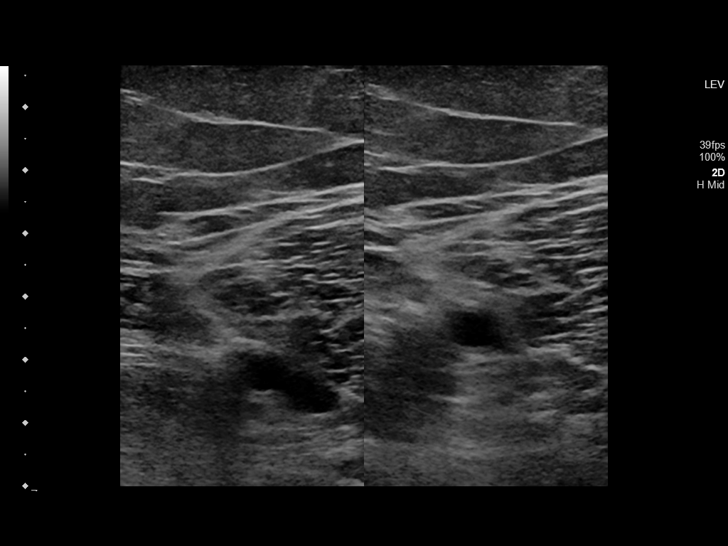
[im 24/33]
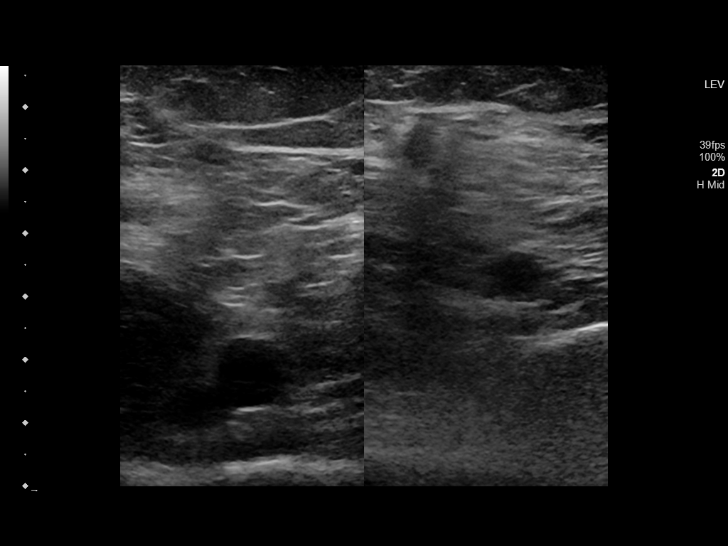
[im 27/33]
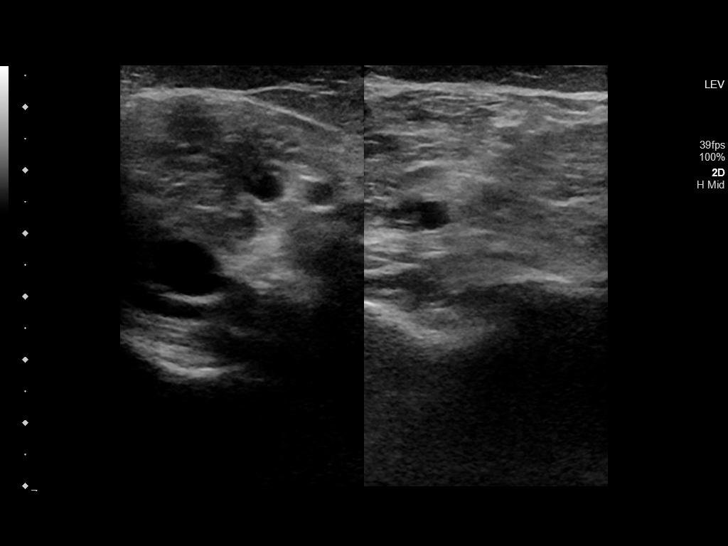
[im 30/33]
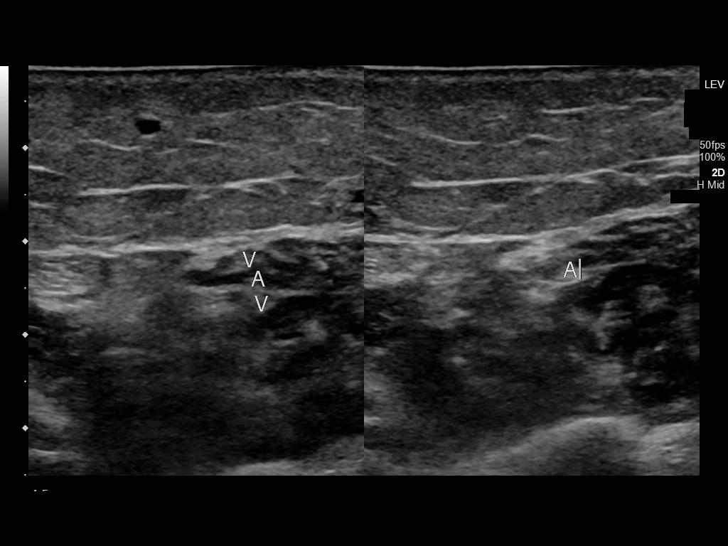
[im 33/33]
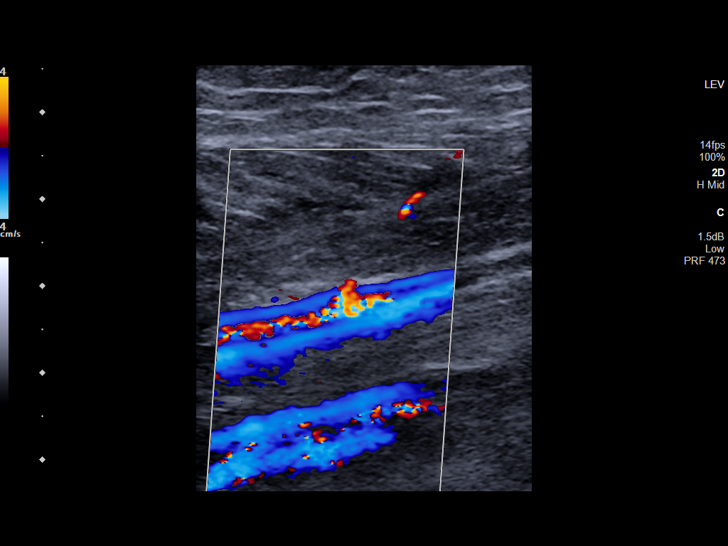

[13 of 24 positions shown; findings below may reference images not displayed]

FINDINGS: LEFT LOWER EXTREMITY

Common Femoral Vein: No evidence of thrombus. Normal
compressibility, respiratory phasicity and response to augmentation.

Central Greater Saphenous Vein: No evidence of thrombus. Normal
compressibility and flow on color Doppler imaging.

Central Profunda Femoral Vein: No evidence of thrombus. Normal
compressibility and flow on color Doppler imaging.

Femoral Vein: No evidence of thrombus. Normal compressibility,
respiratory phasicity and response to augmentation.

Popliteal Vein: No evidence of thrombus. Normal compressibility,
respiratory phasicity and response to augmentation.

Calf Veins: No evidence of thrombus. Normal compressibility and flow
on color Doppler imaging.

Other Findings:  None.

RIGHT LOWER EXTREMITY

Common Femoral Vein: No evidence of thrombus. Normal
compressibility, respiratory phasicity and response to augmentation.
IMPRESSION: No evidence of left lower extremity deep venous thrombosis.

## 2022-07-19 IMAGING — CT CT HEAD W/O CM
3 series · 16 of 47 positions shown, 19 images · non-contrast
Comparison: No pertinent prior exams available for comparison.

CLINICAL DATA: Provided history: Headache, intracranial hemorrhage
suspected. Additional history provided: Hypertension, patient
reports feeling weak and dizzy.

EXAM:
CT HEAD WITHOUT CONTRAST
TECHNIQUE: Contiguous axial images were obtained from the base of the skull
through the vertex without intravenous contrast.

[Series 2: head wo · axial · 0.40mm/px · z∈[+1518,+1643]mm · 10 of 30 slices shown, 13 images]
[im 3/30  brain]
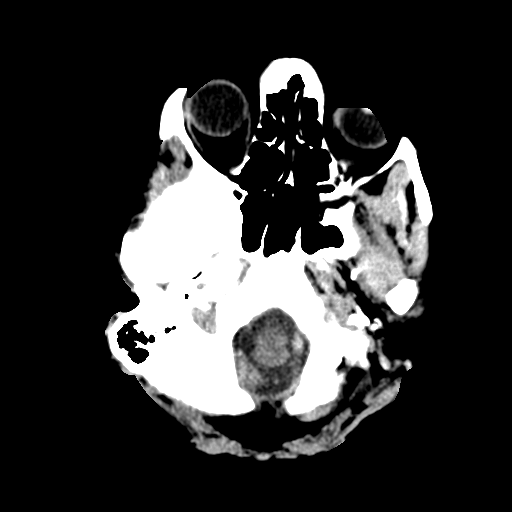
[im 3/30  bone]
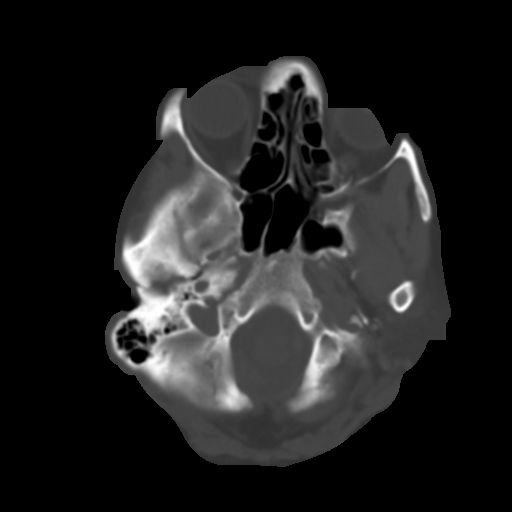
[im 6/30  brain]
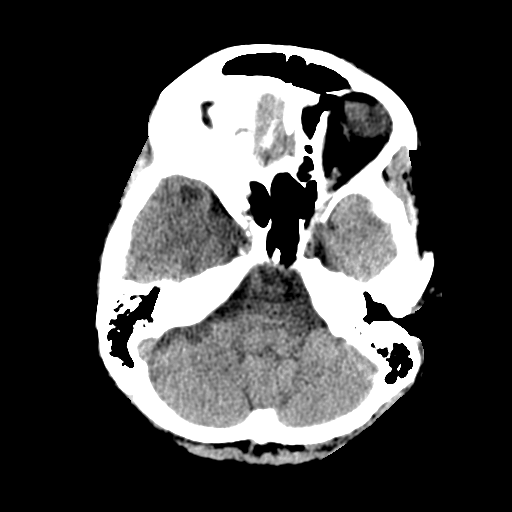
[im 9/30  brain]
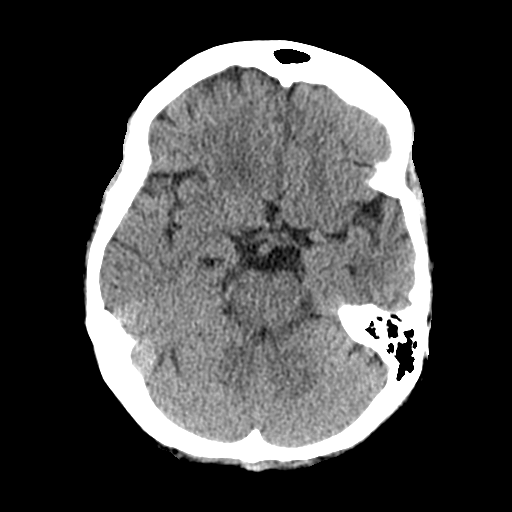
[im 11/30  brain]
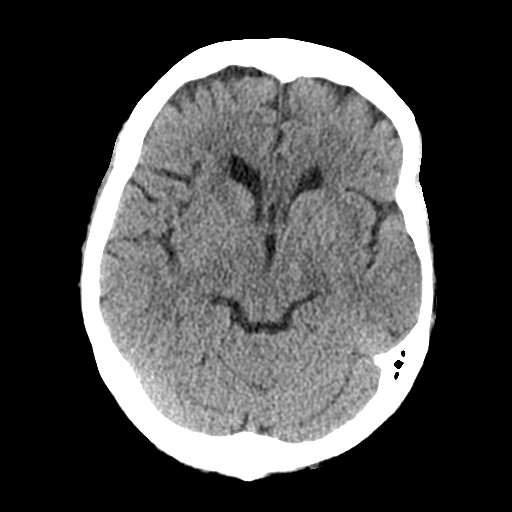
[im 14/30  brain]
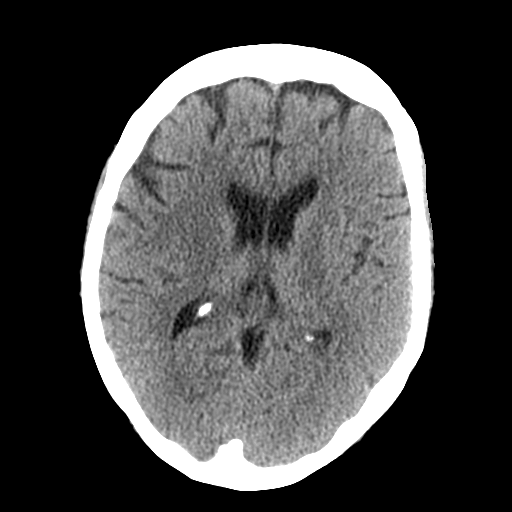
[im 14/30  bone]
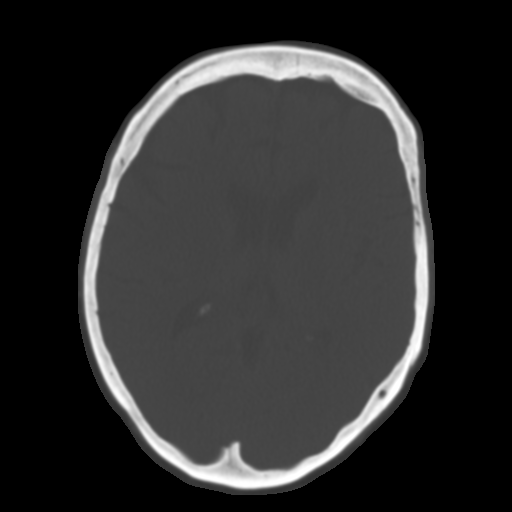
[im 17/30  brain]
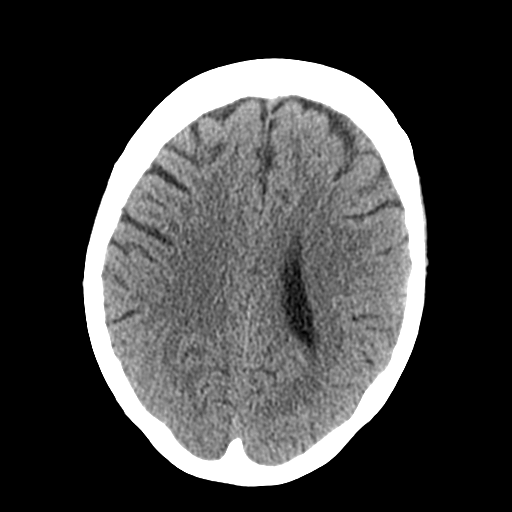
[im 20/30  brain]
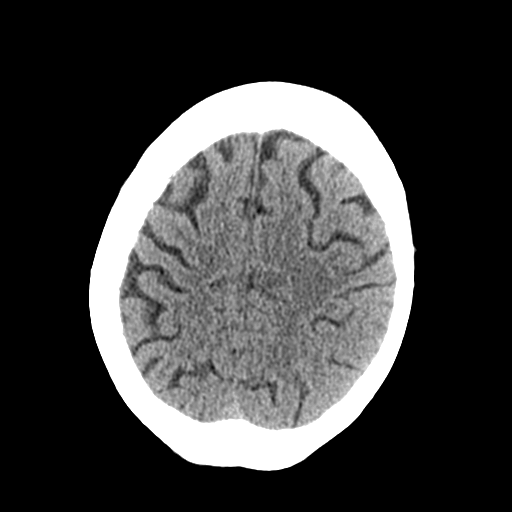
[im 23/30  brain]
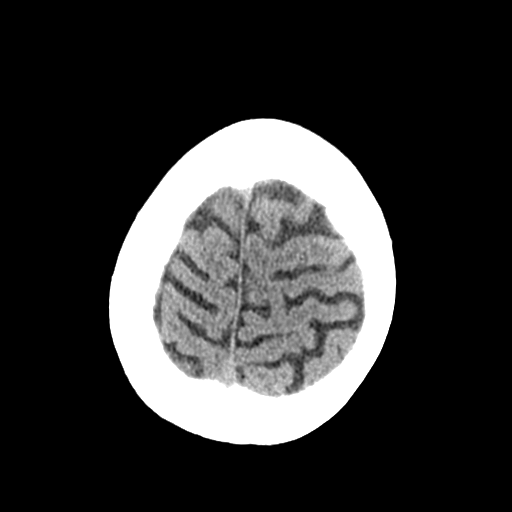
[im 25/30  brain]
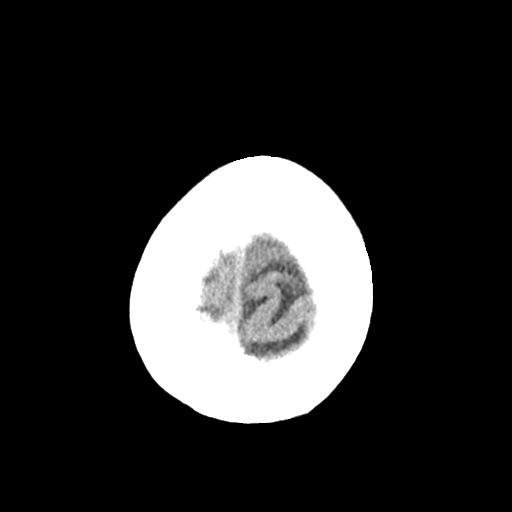
[im 25/30  bone]
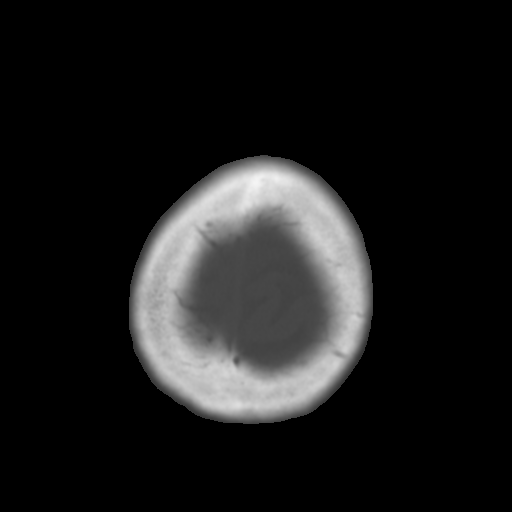
[im 28/30  brain]
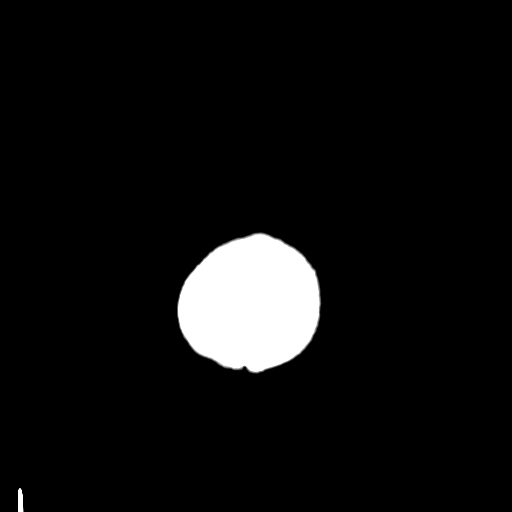

[Series 4: coronal soft tissue · coronal · 0.31mm/px · 3 of 64 slices shown]
[im 22/64  brain]
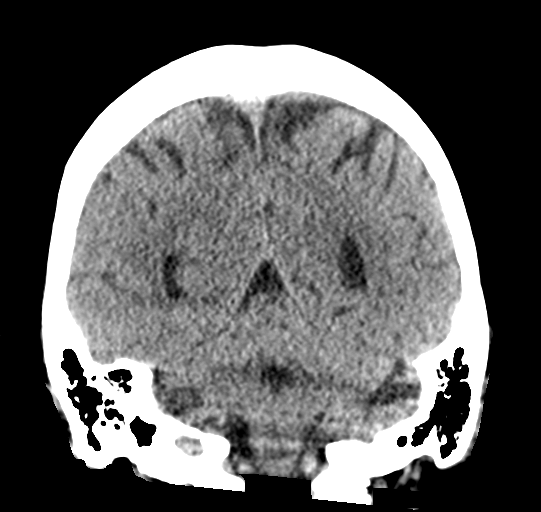
[im 29/64  brain]
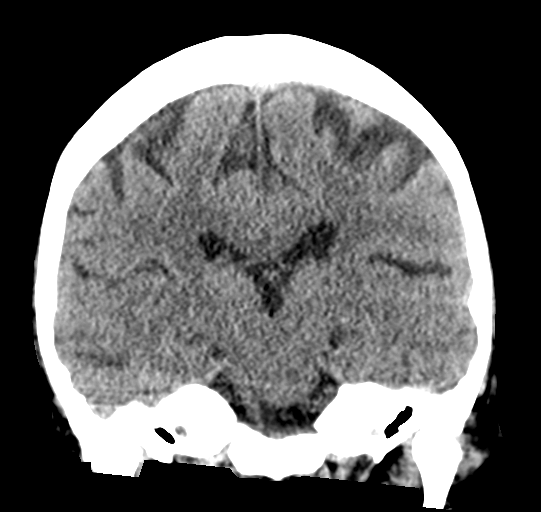
[im 36/64  brain]
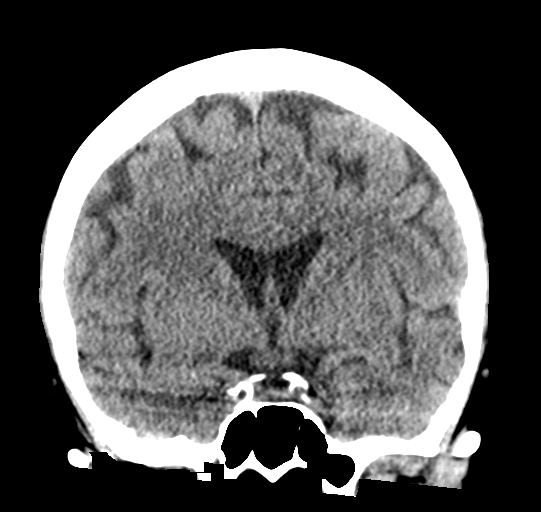

[Series 5: sagittal soft tissue · sagittal · 0.31mm/px · 3 of 51 slices shown]
[im 17/51  brain]
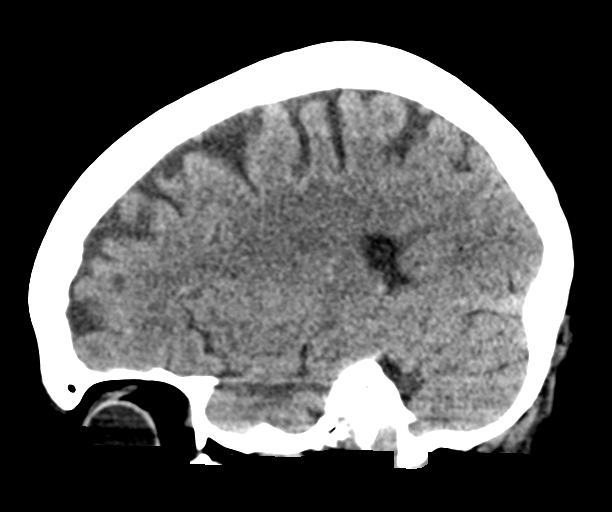
[im 26/51  brain]
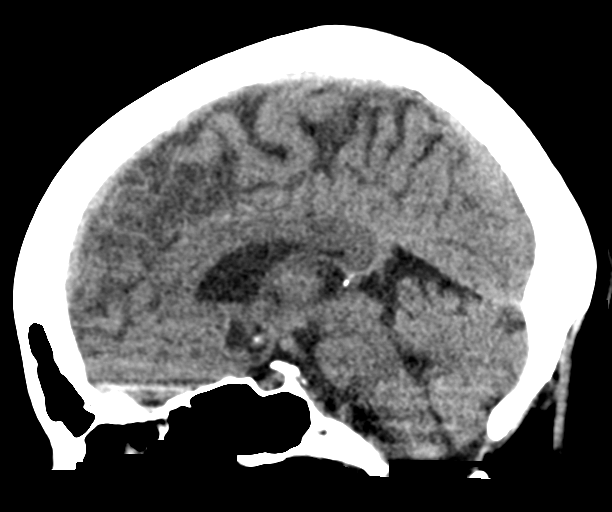
[im 34/51  brain]
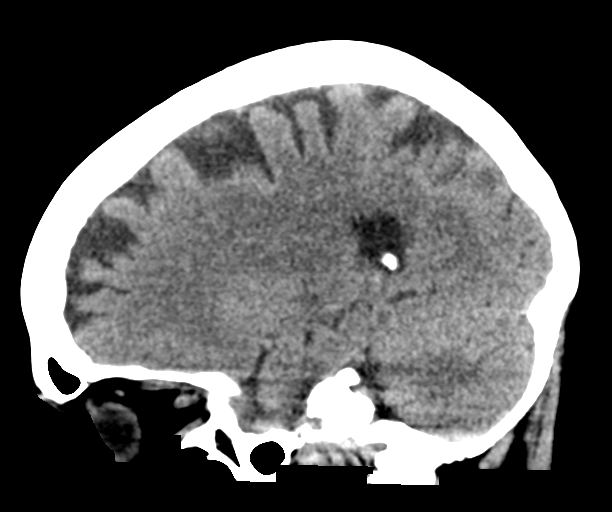

[16 of 47 positions shown; findings below may reference images not displayed]

FINDINGS: Brain:

Mild cerebral atrophy.

There is no acute intracranial hemorrhage.

No demarcated cortical infarct.

No extra-axial fluid collection.

No evidence of intracranial mass.

No midline shift.

Vascular: No hyperdense vessel.  Atherosclerotic calcifications.

Skull: Normal. Negative for fracture or focal lesion.

Sinuses/Orbits: Visualized orbits show no acute finding. Trace
bilateral ethmoid sinus mucosal thickening at the imaged levels.
IMPRESSION: No evidence of acute intracranial abnormality.

Mild cerebral atrophy.

Trace bilateral ethmoid sinus mucosal thickening at the imaged
levels.

## 2022-08-06 IMAGING — US US THYROID
1 series · 13 of 25 positions shown · non-contrast
Comparison: None.

CLINICAL DATA: Remote history of cyst.  Hyperthyroidism.

EXAM:
THYROID ULTRASOUND
TECHNIQUE: Ultrasound examination of the thyroid gland and adjacent soft
tissues was performed.

[Series 1: us thyroid · 0.05mm/px · 13 of 49 slices shown]
[im 1/49]
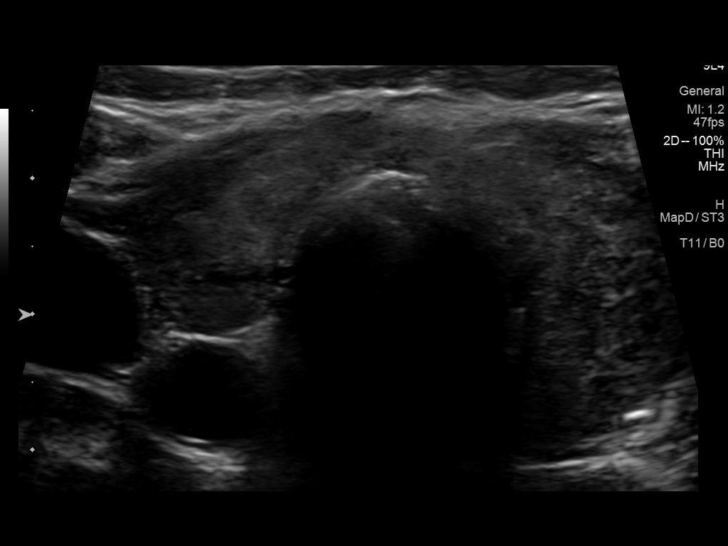
[im 5/49]
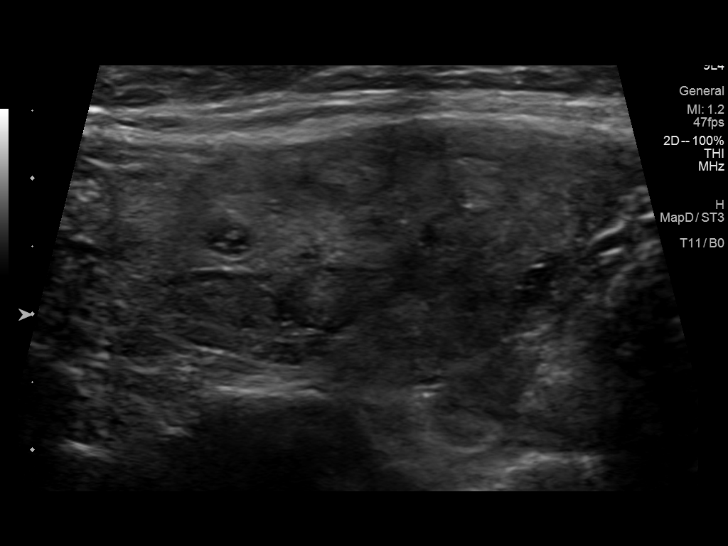
[im 9/49]
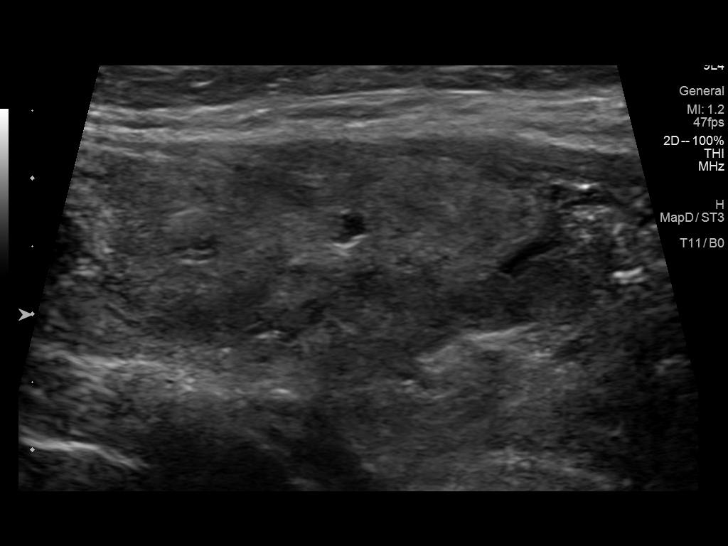
[im 13/49]
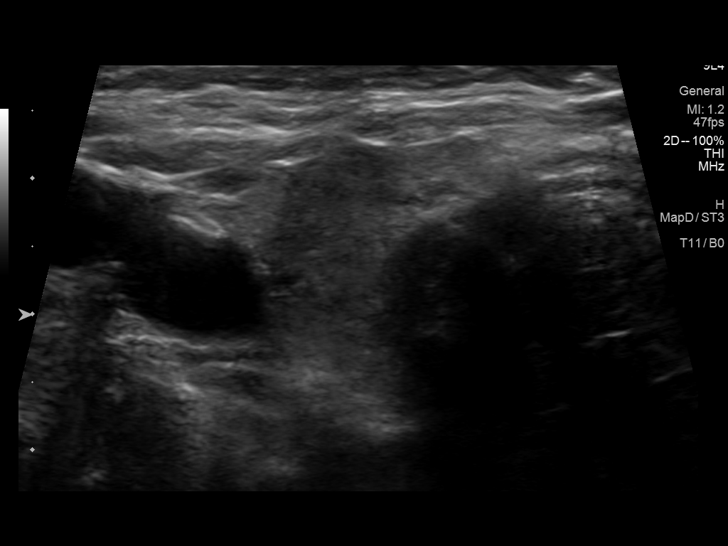
[im 17/49]
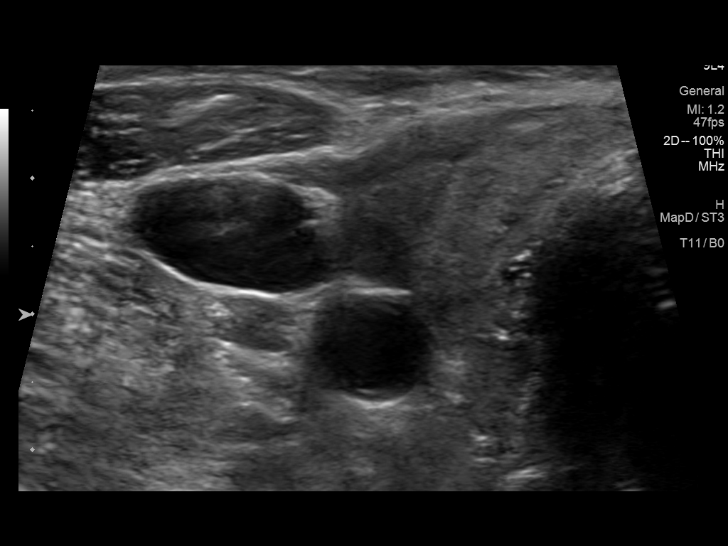
[im 21/49]
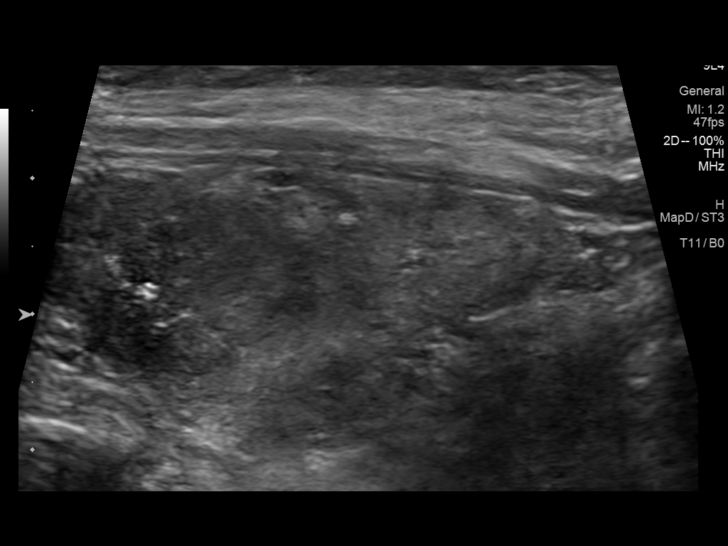
[im 25/49]
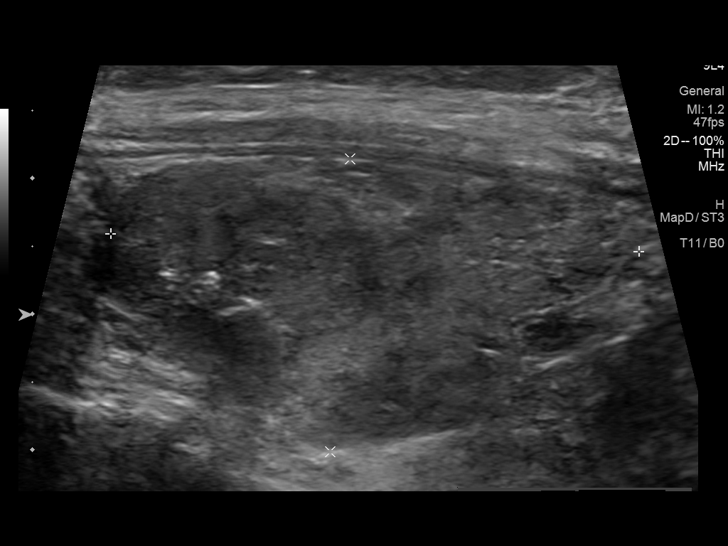
[im 29/49]
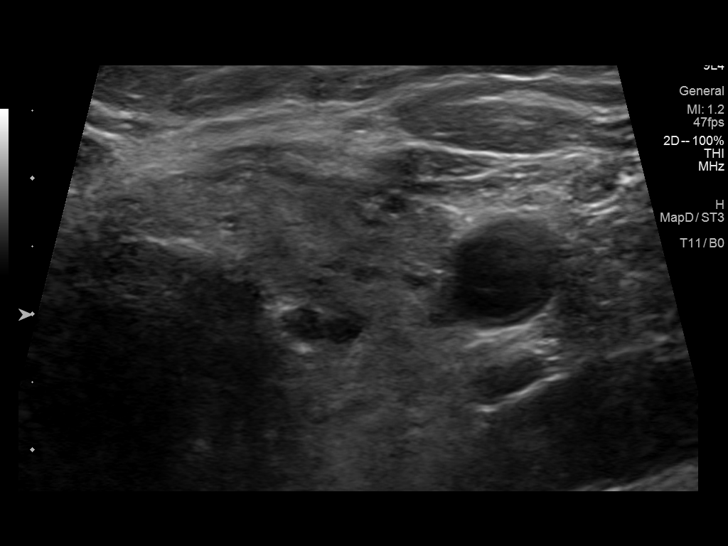
[im 33/49]
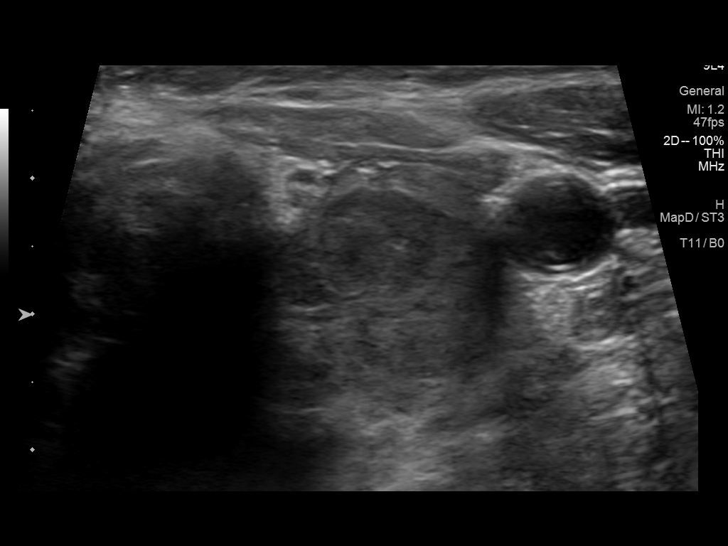
[im 37/49]
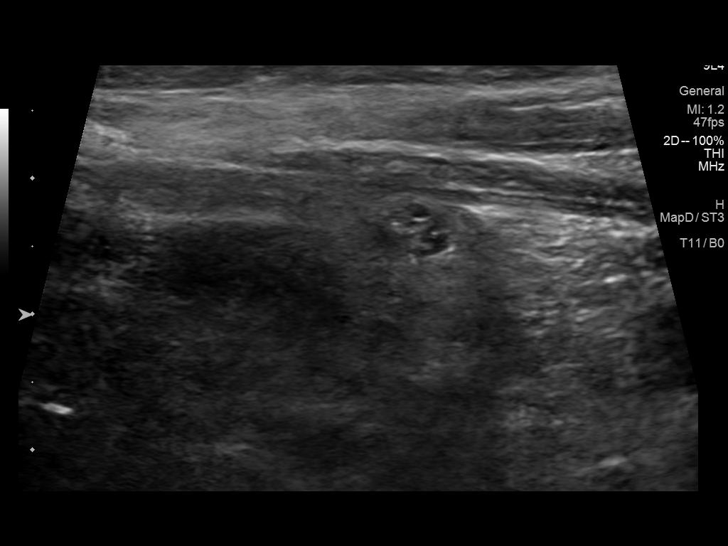
[im 41/49]
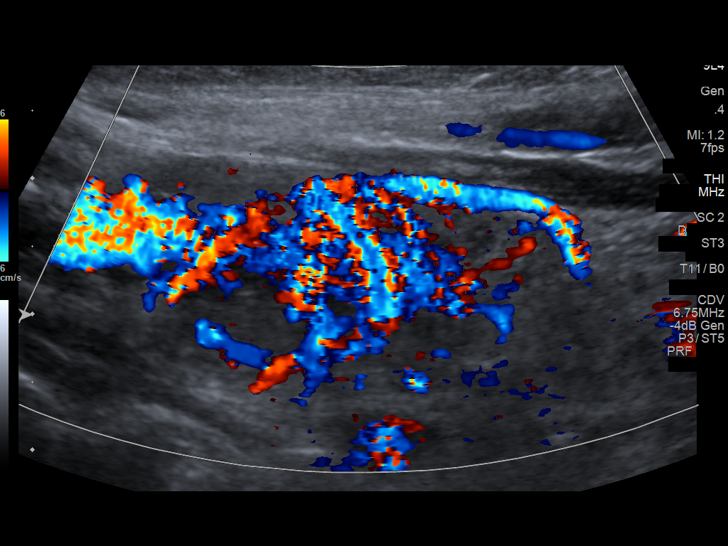
[im 45/49]
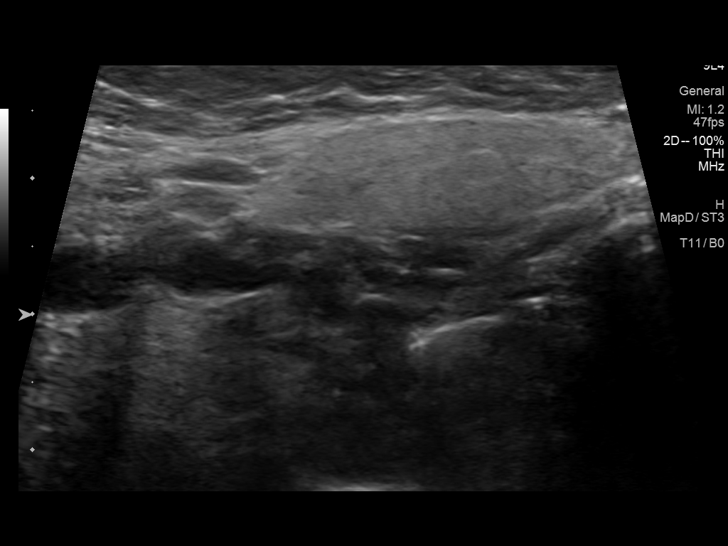
[im 49/49]
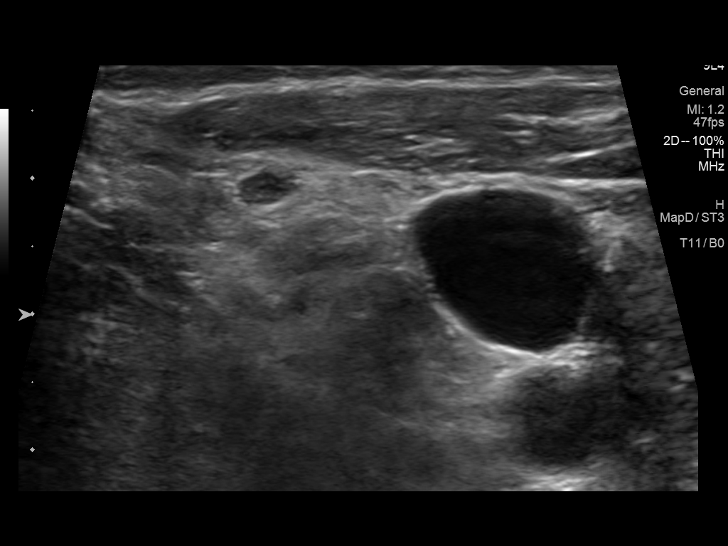

[13 of 25 positions shown; findings below may reference images not displayed]

FINDINGS: Parenchymal Echotexture: Moderately heterogeneous

Isthmus: 0.4 cm

Right lobe: 3.9 x 1.8 x 1.9 cm

Left lobe: 3.9 x 2.2 x 1.8 cm

_________________________________________________________

Estimated total number of nodules >/= 1 cm: 1

Number of spongiform nodules >/=  2 cm not described below (TR1): 0

Number of mixed cystic and solid nodules >/= 1.5 cm not described
below (TR2): 0

_________________________________________________________

Nodule # 1:

Location: Left; superior

Maximum size: 2.1 cm; Other 2 dimensions: 1.4 x 1.4 cm

Composition: solid/almost completely solid (2)

Echogenicity: isoechoic (1)

Shape: not taller-than-wide (0)

Margins: smooth (0)

Echogenic foci: punctate echogenic foci (3)

ACR TI-RADS total points: 4.

ACR TI-RADS risk category: TR4 (4-6 points).

ACR TI-RADS recommendations:

**Given size (>/= 1.5 cm) and appearance, fine needle aspiration of
this moderately suspicious nodule should be considered based on
TI-RADS criteria.

_________________________________________________________

Subcentimeter spongiform nodule in the inferior left thyroid lobe
does not meet criteria for FNA or imaging follow-up.
IMPRESSION: Nodule 1 located in the superior left thyroid lobe meets criteria
for FNA.

The above is in keeping with the ACR TI-RADS recommendations - [HOSPITAL] 7462;[DATE].

## 2022-08-10 ENCOUNTER — Encounter (INDEPENDENT_AMBULATORY_CARE_PROVIDER_SITE_OTHER): Payer: Self-pay

## 2022-11-19 ENCOUNTER — Ambulatory Visit: Payer: Medicare Other | Admitting: Dermatology

## 2023-07-14 ENCOUNTER — Ambulatory Visit: Payer: Medicare Other | Admitting: Dermatology

## 2024-04-25 ENCOUNTER — Encounter: Payer: Self-pay | Admitting: Dermatology

## 2024-04-25 ENCOUNTER — Ambulatory Visit: Admitting: Dermatology

## 2024-04-25 DIAGNOSIS — L720 Epidermal cyst: Secondary | ICD-10-CM

## 2024-04-25 DIAGNOSIS — Z85820 Personal history of malignant melanoma of skin: Secondary | ICD-10-CM

## 2024-04-25 DIAGNOSIS — L570 Actinic keratosis: Secondary | ICD-10-CM

## 2024-04-25 DIAGNOSIS — L820 Inflamed seborrheic keratosis: Secondary | ICD-10-CM | POA: Diagnosis not present

## 2024-04-25 DIAGNOSIS — Z808 Family history of malignant neoplasm of other organs or systems: Secondary | ICD-10-CM

## 2024-04-25 DIAGNOSIS — L578 Other skin changes due to chronic exposure to nonionizing radiation: Secondary | ICD-10-CM

## 2024-04-25 DIAGNOSIS — W908XXA Exposure to other nonionizing radiation, initial encounter: Secondary | ICD-10-CM | POA: Diagnosis not present

## 2024-04-25 DIAGNOSIS — Z1283 Encounter for screening for malignant neoplasm of skin: Secondary | ICD-10-CM | POA: Diagnosis not present

## 2024-04-25 DIAGNOSIS — D1801 Hemangioma of skin and subcutaneous tissue: Secondary | ICD-10-CM

## 2024-04-25 DIAGNOSIS — D229 Melanocytic nevi, unspecified: Secondary | ICD-10-CM

## 2024-04-25 DIAGNOSIS — L814 Other melanin hyperpigmentation: Secondary | ICD-10-CM

## 2024-04-25 DIAGNOSIS — L821 Other seborrheic keratosis: Secondary | ICD-10-CM

## 2024-04-25 DIAGNOSIS — L729 Follicular cyst of the skin and subcutaneous tissue, unspecified: Secondary | ICD-10-CM

## 2024-04-25 DIAGNOSIS — Z86006 Personal history of melanoma in-situ: Secondary | ICD-10-CM

## 2024-04-25 NOTE — Progress Notes (Signed)
 Follow-Up Visit   Subjective  Kendra Vang is a 78 y.o. female who presents for the following: Skin Cancer Screening and Full Body Skin Exam hx of Melanoma IS, Ophthalmologist wanted you to check spot on R lower eyelid, ~2wks, check spot R lower leg 6wks, no symptoms, fhx of Melanoma, daughter  The patient presents for Total-Body Skin Exam (TBSE) for skin cancer screening and mole check. The patient has spots, moles and lesions to be evaluated, some may be new or changing and the patient may have concern these could be cancer.  The following portions of the chart were reviewed this encounter and updated as appropriate: medications, allergies, medical history  Review of Systems:  No other skin or systemic complaints except as noted in HPI or Assessment and Plan.  Objective  Well appearing patient in no apparent distress; mood and affect are within normal limits.  A full examination was performed including scalp, head, eyes, ears, nose, lips, neck, chest, axillae, abdomen, back, buttocks, bilateral upper extremities, bilateral lower extremities, hands, feet, fingers, toes, fingernails, and toenails. All findings within normal limits unless otherwise noted below.   Relevant physical exam findings are noted in the Assessment and Plan.  glabella x 4 (4) Pink scaly macules R temple area x 2 (2) Stuck on waxy paps with erythema  Assessment & Plan   SKIN CANCER SCREENING PERFORMED TODAY.  ACTINIC DAMAGE - Chronic condition, secondary to cumulative UV/sun exposure - diffuse scaly erythematous macules with underlying dyspigmentation - Recommend daily broad spectrum sunscreen SPF 30+ to sun-exposed areas, reapply every 2 hours as needed.  - Staying in the shade or wearing long sleeves, sun glasses (UVA+UVB protection) and wide brim hats (4-inch brim around the entire circumference of the hat) are also recommended for sun protection.  - Call for new or changing lesions.  LENTIGINES,  SEBORRHEIC KERATOSES, HEMANGIOMAS - Benign normal skin lesions - Benign-appearing - Call for any changes  MELANOCYTIC NEVI - Tan-brown and/or pink-flesh-colored symmetric macules and papules - Benign appearing on exam today - Observation - Call clinic for new or changing moles - Recommend daily use of broad spectrum spf 30+ sunscreen to sun-exposed areas.   HISTORY OF MELANOMA IN SITU - No evidence of recurrence today No Lymphadenopathy - Recommend regular full body skin exams - Recommend daily broad spectrum sunscreen SPF 30+ to sun-exposed areas, reapply every 2 hours as needed.  - Call if any new or changing lesions are noted between office visits  - R nasal tip treated [facility] Dr[facility] Gregorio [facility] in 2011  FAMILY HISTORY OF SKIN CANCER What type(s):Melanoma x 2 Who affected: daughter   MILIA R lower eyelid, L temple - tiny firm white papules - type of cyst - benign - sometimes these will clear with nightly OTC adapalene/Differin 0.1% gel or retinol. - may be extracted if symptomatic - observe  AK (ACTINIC KERATOSIS) (4) glabella x 4 (4) Actinic keratoses are precancerous spots that appear secondary to cumulative UV radiation exposure/sun exposure over time. They are chronic with expected duration over 1 year. A portion of actinic keratoses will progress to squamous cell carcinoma of the skin. It is not possible to reliably predict which spots will progress to skin cancer and so treatment is recommended to prevent development of skin cancer.  Recommend daily broad spectrum sunscreen SPF 30+ to sun-exposed areas, reapply every 2 hours as needed.  Recommend staying in the shade or wearing long sleeves, sun glasses (UVA+UVB protection) and wide brim hats (4-inch  brim around the entire circumference of the hat). Call for new or changing lesions. Destruction of lesion - glabella x 4 (4) Complexity: simple   Destruction method: cryotherapy   Informed consent: discussed and consent  obtained   Timeout:  patient name, date of birth, surgical site, and procedure verified Lesion destroyed using liquid nitrogen: Yes   Region frozen until ice ball extended beyond lesion: Yes   Outcome: patient tolerated procedure well with no complications   Post-procedure details: wound care instructions given    INFLAMED SEBORRHEIC KERATOSIS (2) R temple area x 2 (2) Symptomatic, irritating, patient would like treated. Destruction of lesion - R temple area x 2 (2) Complexity: simple   Destruction method: cryotherapy   Informed consent: discussed and consent obtained   Timeout:  patient name, date of birth, surgical site, and procedure verified Lesion destroyed using liquid nitrogen: Yes   Region frozen until ice ball extended beyond lesion: Yes   Outcome: patient tolerated procedure well with no complications   Post-procedure details: wound care instructions given     Return in about 1 year (around 04/25/2025) for TBSE, Hx of Melanoma IS, Hx of AKs.  I, Grayce Saunas, RMA, am acting as scribe for Alm Rhyme, MD .   Documentation: I have reviewed the above documentation for accuracy and completeness, and I agree with the above.  Alm Rhyme, MD

## 2024-04-25 NOTE — Patient Instructions (Addendum)
 [place] Right lower eyelid, firm white papule, type of cyst   Cryotherapy Aftercare  Wash gently with soap and water everyday.   Apply Vaseline and Band-Aid daily until healed.   Actinic Keratosis  What is an actinic keratosis? An actinic keratosis (plural: actinic keratoses) is growth on the surface of the skin that usually appears as a red, hard, crusty or scaly bump.   What causes actinic keratoses? Repeated prolonged sun exposure causes skin damage, especially in fair-skinned persons. Sun-damaged skin becomes dry and wrinkled and may form rough, scaly spots called actinic keratoses. These rough spots remain on the skin even though the crust or scale on top is picked off.   Why treat actinic keratoses? Actinic keratoses are not skin cancers, but because they may sometimes turn cancerous they are called "pre-cancerous". Not all will turn to skin cancer, and it usually takes several years for this to happen. Because it is much easier to treat an actinic keratosis then it is to remove a skin cancer, actinic keratoses should be treated to prevent future skin cancer.   How are actinic keratoses treated? The most common way of treating actinic keratoses is to freeze them with liquid nitrogen. Freezing causes scabbing and shedding of the sun-damaged skin. Healing after a removal usually takes two weeks, depending on the size and location of the keratosis. Hands and legs heal more slowly than the face. The skin's final appearance is usually excellent. There are several topical medications that can be used to treat actinic keratoses. These medications generally have side effects of redness, crusting, and pain. Some are used for a few days, and some for several months before the actinic keratosis is completely gone. Photodynamic therapy is another alternative to freezing actinic keratoses. This treatment is done in a physician's office. A medication is applied to the area of skin with actinic keratoses,  and it is allowed to soak in for one or more hours. A special light is then applied to the skin. Side effects include redness, burning, and peeling.  How can you prevent actinic keratoses? Protection from the sun is the best way to prevent actinic keratoses. The use of proper clothing and sunscreens can prevent the sun damage that leads to an actinic keratosis.  Unfortunately, some sun damage is permanent. Once sun damage has progressed to the point where actinic keratoses develop, new keratoses may appear even without further sun exposure. However, even in skin that is already heavily sun damaged, good sun protection can help reduce the number of actinic keratoses that will appear.    Seborrheic Keratosis  What causes seborrheic keratoses? Seborrheic keratoses are harmless, common skin growths that first appear during adult life.  As time goes by, more growths appear.  Some people may develop a large number of them.  Seborrheic keratoses appear on both covered and uncovered body parts.  They are not caused by sunlight.  The tendency to develop seborrheic keratoses can be inherited.  They vary in color from skin-colored to gray, brown, or even black.  They can be either smooth or have a rough, warty surface.   Seborrheic keratoses are superficial and look as if they were stuck on the skin.  Under the microscope this type of keratosis looks like layers upon layers of skin.  That is why at times the top layer may seem to fall off, but the rest of the growth remains and re-grows.    Treatment Seborrheic keratoses do not need to be treated, but can  easily be removed in the office.  Seborrheic keratoses often cause symptoms when they rub on clothing or jewelry.  Lesions can be in the way of shaving.  If they become inflamed, they can cause itching, soreness, or burning.  Removal of a seborrheic keratosis can be accomplished by freezing, burning, or surgery. If any spot bleeds, scabs, or grows rapidly,  please return to have it checked, as these can be an indication of a skin cancer.   Due to recent changes in healthcare laws, you may see results of your pathology and/or laboratory studies on MyChart before the doctors have had a chance to review them. We understand that in some cases there may be results that are confusing or concerning to you. Please understand that not all results are received at the same time and often the doctors may need to interpret multiple results in order to provide you with the best plan of care or course of treatment. Therefore, we ask that you please give us  2 business days to thoroughly review all your results before contacting the office for clarification. Should we see a critical lab result, you will be contacted sooner.   If You Need Anything After Your Visit  If you have any questions or concerns for your doctor, please call our main line at [phone] and press option 4 to reach your doctor's medical assistant. If no one answers, please leave a voicemail as directed and we will return your call as soon as possible. Messages left after 4 pm will be answered the following business day.   You may also send us  a message via MyChart. We typically respond to MyChart messages within 1-2 business days.  For prescription refills, please ask your pharmacy to contact our office. Our fax number is [phone].  If you have an urgent issue when the clinic is closed that cannot wait until the next business day, you can page your doctor at the number below.    Please note that while we do our best to be available for urgent issues outside of office hours, we are not available 24/7.   If you have an urgent issue and are unable to reach us , you may choose to seek medical care at your doctor's office, retail clinic, urgent care center, or emergency room.  If you have a medical emergency, please immediately call 911 or go to the emergency department.  Pager Numbers  - Dr.  Hester: [phone]  - Dr. Jackquline: [phone]  - Dr. Claudene: [phone]   In the event of inclement weather, please call our main line at [phone] for an update on the status of any delays or closures.  Dermatology Medication Tips: Please keep the boxes that topical medications come in in order to help keep track of the instructions about where and how to use these. Pharmacies typically print the medication instructions only on the boxes and not directly on the medication tubes.   If your medication is too expensive, please contact our office at [phone] option 4 or send us  a message through MyChart.   We are unable to tell what your co-pay for medications will be in advance as this is different depending on your insurance coverage. However, we may be able to find a substitute medication at lower cost or fill out paperwork to get insurance to cover a needed medication.   If a prior authorization is required to get your medication covered by your insurance company, please allow us  1-2 business days to complete  this process.  Drug prices often vary depending on where the prescription is filled and some pharmacies may offer cheaper prices.  The website www.goodrx.com contains coupons for medications through different pharmacies. The prices here do not account for what the cost may be with help from insurance (it may be cheaper with your insurance), but the website can give you the price if you did not use any insurance.  - You can print the associated coupon and take it with your prescription to the pharmacy.  - You may also stop by our office during regular business hours and pick up a GoodRx coupon card.  - If you need your prescription sent electronically to a different pharmacy, notify our office through [facility] or by phone at [phone] option 4.     Si Usted Necesita Algo Despus de Su Visita  Tambin puede enviarnos un mensaje a travs de Clinical cytogeneticist. Por lo  general respondemos a los mensajes de MyChart en el transcurso de 1 a 2 das hbiles.  Para renovar recetas, por favor pida a su farmacia que se ponga en contacto con nuestra oficina. Randi lakes de fax es [place] [phone].  Si tiene un asunto urgente cuando la clnica est cerrada y que no puede esperar hasta el siguiente da hbil, puede llamar/localizar a su doctor(a) al nmero que aparece a continuacin.   Por favor, tenga en cuenta que aunque hacemos todo lo posible para estar disponibles para asuntos urgentes fuera del horario de [place], no estamos disponibles las 24 horas del da, los 7 [address]  [address] de la [place].   Si tiene un problema urgente y no puede comunicarse con nosotros, puede optar por buscar atencin mdica  en el consultorio de su doctor(a), en una clnica privada, en un centro de atencin urgente o en una sala de emergencias.  Si tiene Engineer, drilling, por favor llame inmediatamente al 911 o vaya a la sala de emergencias.  Nmeros de bper  - Dr. Hester: [phone]  - Dra. Jackquline: [phone]  - Dr. Claudene: [phone]   En caso de inclemencias del tiempo, por favor llame a landry capes principal al [phone] para una actualizacin sobre el [place] de cualquier retraso o cierre.  Consejos para la medicacin en dermatologa: Por favor, guarde las cajas en las que vienen los medicamentos de uso tpico para ayudarle a seguir las instrucciones sobre dnde y cmo usarlos. Las farmacias generalmente imprimen las instrucciones del medicamento slo en las cajas y no directamente en los tubos del [place].   Si su medicamento es muy caro, por favor, pngase en contacto con landry rieger llamando al [phone] y presione la opcin 4 o envenos un mensaje a travs de Clinical cytogeneticist.   No podemos decirle cul ser su copago por los medicamentos por adelantado ya que esto es diferente dependiendo de la cobertura de su seguro. Sin embargo, es posible que podamos encontrar  un medicamento sustituto a Audiological scientist un formulario para que el seguro cubra el medicamento que se considera necesario.   Si se requiere una autorizacin previa para que su compaa de seguros malta su medicamento, por favor permtanos de 1 a 2 das hbiles para completar este proceso.  Los precios de los medicamentos varan con frecuencia dependiendo del Environmental consultant de dnde se surte la receta y alguna farmacias pueden ofrecer precios ms baratos.  El sitio web www.goodrx.com tiene cupones para medicamentos de Health and safety inspector. Los precios aqu no tienen en cuenta lo que podra costar con la ayuda del  seguro (puede ser ms barato con su seguro), pero el sitio web puede darle el precio si no Visual merchandiser.  - Puede imprimir el cupn correspondiente y llevarlo con su receta a la farmacia.  - Tambin puede pasar por nuestra oficina durante el horario de atencin regular y Education officer, museum una tarjeta de cupones de GoodRx.  - Si necesita que su receta se enve electrnicamente a una farmacia diferente, informe a nuestra oficina a travs de MyChart de [employer] o por telfono llamando al [phone] y presione la opcin 4.

## 2024-05-26 ENCOUNTER — Other Ambulatory Visit: Payer: Self-pay

## 2024-05-26 DIAGNOSIS — Z79899 Other long term (current) drug therapy: Secondary | ICD-10-CM | POA: Diagnosis not present

## 2024-05-26 DIAGNOSIS — I10 Essential (primary) hypertension: Secondary | ICD-10-CM | POA: Insufficient documentation

## 2024-05-26 DIAGNOSIS — R519 Headache, unspecified: Secondary | ICD-10-CM | POA: Diagnosis present

## 2024-05-26 LAB — CBC WITH DIFFERENTIAL/PLATELET
Abs Immature Granulocytes: 0.03 K/uL (ref 0.00–0.07)
Basophils Absolute: 0 K/uL (ref 0.0–0.1)
Basophils Relative: 1 %
Eosinophils Absolute: 0.5 K/uL (ref 0.0–0.5)
Eosinophils Relative: 7 %
HCT: 39.1 % (ref 36.0–46.0)
Hemoglobin: 13.3 g/dL (ref 12.0–15.0)
Immature Granulocytes: 1 %
Lymphocytes Relative: 37 %
Lymphs Abs: 2.5 K/uL (ref 0.7–4.0)
MCH: 31.1 pg (ref 26.0–34.0)
MCHC: 34 g/dL (ref 30.0–36.0)
MCV: 91.6 fL (ref 80.0–100.0)
Monocytes Absolute: 0.5 K/uL (ref 0.1–1.0)
Monocytes Relative: 8 %
Neutro Abs: 3.2 K/uL (ref 1.7–7.7)
Neutrophils Relative %: 46 %
Platelets: 253 K/uL (ref 150–400)
RBC: 4.27 MIL/uL (ref 3.87–5.11)
RDW: 13.1 % (ref 11.5–15.5)
WBC: 6.7 K/uL (ref 4.0–10.5)
nRBC: 0 % (ref 0.0–0.2)

## 2024-05-26 LAB — BASIC METABOLIC PANEL WITH GFR
Anion gap: 10 (ref 5–15)
BUN: 26 mg/dL — ABNORMAL HIGH (ref 8–23)
CO2: 24 mmol/L (ref 22–32)
Calcium: 9.3 mg/dL (ref 8.9–10.3)
Chloride: 104 mmol/L (ref 98–111)
Creatinine, Ser: 1.36 mg/dL — ABNORMAL HIGH (ref 0.44–1.00)
GFR, Estimated: 40 mL/min — ABNORMAL LOW (ref >60)
Glucose, Bld: 117 mg/dL — ABNORMAL HIGH (ref 70–99)
Potassium: 4.1 mmol/L (ref 3.5–5.1)
Sodium: 138 mmol/L (ref 135–145)

## 2024-05-26 NOTE — ED Triage Notes (Signed)
 Pt arrives with c/o HTN. Pt does have hx of the same. Per pt, her BP has been steadily increasing throughout the day. Pt has not missed any doses of her BP meds. Pt reports headache. Pt denies CP, SOB, or vision changes.

## 2024-05-27 ENCOUNTER — Emergency Department
Admission: EM | Admit: 2024-05-27 | Discharge: 2024-05-27 | Disposition: A | Discharge status: Home / Self Care | Attending: Emergency Medicine | Admitting: Emergency Medicine

## 2024-05-27 DIAGNOSIS — I10 Essential (primary) hypertension: Secondary | ICD-10-CM

## 2024-05-27 MED ORDER — NIFEDIPINE ER OSMOTIC RELEASE 30 MG PO TB24: 30.0000 mg | ORAL_TABLET | Freq: Every day | ORAL | 0 refills | Status: AC

## 2024-05-27 MED ORDER — NIFEDIPINE ER OSMOTIC RELEASE 30 MG PO TB24
30.0000 mg | ORAL_TABLET | Freq: Once | ORAL | Status: AC
  Administered 2024-05-27: 30 mg via ORAL
  Filled 2024-05-27: qty 1

## 2024-05-27 NOTE — ED Provider Notes (Signed)
 [facility] Provider Note   Event Date/Time   First MD Initiated Contact with Patient 05/27/24 [phone]     (approximate) History  Hypertension  HPI [name] Natarajan is a 78 y.o. female with past medical history of hypertension who presents complaining of uncontrolled hypertension.  Patient states that she has seen her blood pressure increased throughout the past 2 days however today her blood pressure elevated to the 190s at home as well as into the 220s here in an our emergency department.  Patient only complained of dull headache and states that she has taken all of her medications on time as prescribed including hydrochlorothiazide and telmisartan.  Patient denies any change in dosage of these medications recently.  Patient states that she was supposed to start amlodipine however was told by her physician that she has had a reaction to it in the past.  Patient does not recall any such reaction ROS: Patient currently denies any vision changes, tinnitus, difficulty speaking, facial droop, sore throat, chest pain, shortness of breath, abdominal pain, nausea/vomiting/diarrhea, dysuria, or weakness/numbness/paresthesias in any extremity   Physical Exam  Triage Vital Signs: ED Triage Vitals  Encounter Vitals Group     BP 05/26/24 2220 (!) 221/98     Girls Systolic BP Percentile --      Girls Diastolic BP Percentile --      Boys Systolic BP Percentile --      Boys Diastolic BP Percentile --      Pulse Rate 05/26/24 2220 73     Resp 05/26/24 2220 16     Temp 05/26/24 2220 98.2 F (36.8 C)     Temp Source 05/26/24 2220 Oral     SpO2 05/26/24 2220 98 %     Weight 05/26/24 2221 165 lb (74.8 kg)     Height --      Head Circumference --      Peak Flow --      Pain Score 05/26/24 2220 5     Pain Loc --      Pain Education --      Exclude from Growth Chart --    Most recent vital signs: Vitals:   05/26/24 2220 05/27/24 0215  BP: (!) 221/98 (!) 186/73  Pulse: 73 62   Resp: 16 18  Temp: 98.2 F (36.8 C) 97.7 F (36.5 C)  SpO2: 98% 100%   General: Awake, oriented x4. CV:  Good peripheral perfusion. Resp:  Normal effort. Abd:  No distention. Other:  Elderly overweight Caucasian female resting comfortably in no acute distress ED Results / Procedures / Treatments  Labs (all labs ordered are listed, but only abnormal results are displayed) Labs Reviewed  BASIC METABOLIC PANEL WITH GFR - Abnormal; Notable for the following components:      Result Value   Glucose, Bld 117 (*)    BUN 26 (*)    Creatinine, Ser 1.36 (*)    GFR, Estimated 40 (*)    All other components within normal limits  CBC WITH DIFFERENTIAL/PLATELET   PROCEDURES: Critical Care performed: No Procedures MEDICATIONS ORDERED IN ED: Medications  NIFEdipine (PROCARDIA-XL/NIFEDICAL-XL) 24 hr tablet 30 mg (has no administration in time range)   IMPRESSION / MDM / ASSESSMENT AND PLAN / ED COURSE  I reviewed the triage vital signs and the nursing notes.                             The patient  is on the cardiac monitor to evaluate for evidence of arrhythmia and/or significant heart rate changes. Patient's presentation is most consistent with acute presentation with potential threat to life or bodily function. Presents to the emergency department complaining of high blood pressure. Patient is otherwise asymptomatic without confusion, chest pain, hematuria, or SOB. Denies nonadherence to antihypertensive regimen DDx: CV, AMI, heart failure, renal infarction or failure or other end organ damage.  Disposition: Discussed with patient their elevated blood pressure and need for close outpatient management of their hypertension. Will provide a prescription for nifedipine and arrange for the patient to follow up in a primary care clinic    FINAL CLINICAL IMPRESSION(S) / ED DIAGNOSES   Final diagnoses:  Uncontrolled hypertension   Rx / DC Orders   ED Discharge Orders          Ordered     NIFEdipine (PROCARDIA-XL/NIFEDICAL-XL) 30 MG 24 hr tablet  Daily        05/27/24 0433           Note:  This document was prepared using Dragon voice recognition software and may include unintentional dictation errors.   Merrillyn Ackerley K, MD 05/27/24 [phone]

## 2024-07-18 ENCOUNTER — Ambulatory Visit (INDEPENDENT_AMBULATORY_CARE_PROVIDER_SITE_OTHER)

## 2024-07-18 DIAGNOSIS — D229 Melanocytic nevi, unspecified: Secondary | ICD-10-CM

## 2024-07-18 DIAGNOSIS — L820 Inflamed seborrheic keratosis: Secondary | ICD-10-CM

## 2024-07-18 DIAGNOSIS — L814 Other melanin hyperpigmentation: Secondary | ICD-10-CM

## 2024-07-18 DIAGNOSIS — W908XXA Exposure to other nonionizing radiation, initial encounter: Secondary | ICD-10-CM

## 2024-07-18 DIAGNOSIS — L578 Other skin changes due to chronic exposure to nonionizing radiation: Secondary | ICD-10-CM | POA: Diagnosis not present

## 2024-07-18 DIAGNOSIS — L821 Other seborrheic keratosis: Secondary | ICD-10-CM

## 2024-07-18 DIAGNOSIS — Z85820 Personal history of malignant melanoma of skin: Secondary | ICD-10-CM

## 2024-07-18 DIAGNOSIS — D1801 Hemangioma of skin and subcutaneous tissue: Secondary | ICD-10-CM

## 2024-07-18 DIAGNOSIS — Z872 Personal history of diseases of the skin and subcutaneous tissue: Secondary | ICD-10-CM

## 2024-07-18 DIAGNOSIS — Z1283 Encounter for screening for malignant neoplasm of skin: Secondary | ICD-10-CM | POA: Diagnosis not present

## 2024-07-18 NOTE — Patient Instructions (Addendum)

## 2024-07-18 NOTE — Progress Notes (Signed)
 Subjective   Kendra Vang is a 78 y.o. female who presents for the following: Lesion(s) of concern . Patient is established patient   Today patient reports: Area of concern on her right lower leg that looks similar to the melanoma that was removed for her nose.   She also has an area of concern on her right hip.   Review of Systems:    History of melanoma ROS - denies any fevers, chills, weight loss, night sweats, lymphadenopathy, nausea, vomiting, diarrhea, chest pain, shortness of breath, headaches, changes in vision  .  The following portions of the chart were reviewed this encounter and updated as appropriate: medications, allergies, medical history  Relevant Medical History:  Personal history of melanoma - see medical history for full details  and Personal history of actinic keratosis   Objective  Well appearing patient in no apparent distress; mood and affect are within normal limits. Examination was performed of the: Sun Exposed Exam: Scalp, head, eyes, ears, nose, lips, neck, upper extremities, hands, fingers, fingernails, lower extremities, back Examination notable for: SKIN EXAM, Angioma(s): Scattered red vascular papule(s)  , Lentigo/lentigines: Scattered pigmented macules that are tan to brown in color and are somewhat non-uniform in shape and concentrated in the sun-exposed areas, Nevus/nevi: Scattered well-demarcated, regular, pigmented macule(s) and/or papule(s)  , Seborrheic Keratosis(es): Stuck-on appearing keratotic papule(s) on the trunk, some  irritated with redness, crusting, edema, and/or partial avulsion, Actinic Damage/Elastosis: chronic sun damage: dyspigmentation, telangiectasia, and wrinkling, Actinic keratosis: Scaly erythematous macule(s) concentrated on sun exposed areas , Actinic Purpura: Diffuse atrophy with scattered purpuric patches 1-3 cm in size on the bilateral forearms and dorsal hands. There is evidence of post-inflammatory hyperpigmentation with an  orange-brown color change  Examination limited by: Clothing and Patient deferred removal     Right Hip x4, Right lower leg x1, back x3 (8) Stuck on waxy paps with erythema  Assessment & Plan   SKIN CANCER SCREENING PERFORMED TODAY.  BENIGN SKIN FINDINGS  - Lentigines  - Seborrheic keratoses  - Hemangiomas   - Nevus/Multiple Benign Nevi - Reassurance provided regarding the benign appearance of lesions noted on exam today; no treatment is indicated in the absence of symptoms/changes. - Reinforced importance of photoprotective strategies including liberal and frequent sunscreen use of a broad-spectrum SPF 30 or greater, use of protective clothing, and sun avoidance for prevention of cutaneous malignancy and photoaging.  Counseled patient on the importance of regular self-skin monitoring as well as routine clinical skin examinations as scheduled.   ACTINIC DAMAGE - Chronic condition, secondary to cumulative UV/sun exposure - Recommend daily broad spectrum sunscreen SPF 30+ to sun-exposed areas, reapply every 2 hours as needed.  - Staying in the shade or wearing long sleeves, sun glasses (UVA+UVB protection) and wide brim hats (4-inch brim around the entire circumference of the hat) are also recommended for sun protection.  - Call for new or changing lesions.  Personal history of melanoma and actinic keratosis  - Reviewed medical history for full details  - Reviewed sun protective measures as above - Encouraged full body skin exams    Level of service outlined above   Procedures, orders, diagnosis for this visit:  INFLAMED SEBORRHEIC KERATOSIS (8) Right Hip x4, Right lower leg x1, back x3 (8) Symptomatic, irritating, patient would like treated. Destruction of lesion - Right Hip x4, Right lower leg x1, back x3 (8) Complexity: simple   Destruction method: cryotherapy   Informed consent: discussed and consent obtained   Timeout:  patient name, date of birth, surgical site, and  procedure verified Lesion destroyed using liquid nitrogen: Yes   Region frozen until ice ball extended beyond lesion: Yes   Cryo cycles: 1 or 2. Outcome: patient tolerated procedure well with no complications   Post-procedure details: wound care instructions given     Inflamed seborrheic keratosis -     Destruction of lesion    Return to clinic: Return for appointment as scheduled.  Documentation:  I, Emerick Ege, CMA am acting as scribe for Lauraine JAYSON Kanaris, MD.  I have reviewed the above documentation for accuracy and completeness, and I agree with the above.  Lauraine JAYSON Kanaris, MD

## 2024-12-27 ENCOUNTER — Ambulatory Visit: Admitting: Dermatology

## 2025-04-25 ENCOUNTER — Ambulatory Visit: Admitting: Dermatology
